# Patient Record
Sex: Male | Born: 1965
Health system: Southern US, Community
[De-identification: ages and names within clinical notes are randomized; demographics above are authoritative.]

## PROBLEM LIST (undated history)

## (undated) DIAGNOSIS — F329 Major depressive disorder, single episode, unspecified: Secondary | ICD-10-CM

## (undated) DIAGNOSIS — R7989 Other specified abnormal findings of blood chemistry: Secondary | ICD-10-CM

## (undated) DIAGNOSIS — F1910 Other psychoactive substance abuse, uncomplicated: Secondary | ICD-10-CM

## (undated) DIAGNOSIS — F1011 Alcohol abuse, in remission: Secondary | ICD-10-CM

## (undated) DIAGNOSIS — R945 Abnormal results of liver function studies: Secondary | ICD-10-CM

## (undated) DIAGNOSIS — F32A Depression, unspecified: Secondary | ICD-10-CM

## (undated) DIAGNOSIS — F419 Anxiety disorder, unspecified: Secondary | ICD-10-CM

## (undated) DIAGNOSIS — E785 Hyperlipidemia, unspecified: Secondary | ICD-10-CM

## (undated) HISTORY — DX: Anxiety disorder, unspecified: F41.9

## (undated) HISTORY — DX: Other specified abnormal findings of blood chemistry: R79.89

## (undated) HISTORY — DX: Major depressive disorder, single episode, unspecified: F32.9

## (undated) HISTORY — DX: Depression, unspecified: F32.A

## (undated) HISTORY — PX: INGUINAL HERNIA REPAIR: SUR1180

## (undated) HISTORY — DX: Alcohol abuse, in remission: F10.11

## (undated) HISTORY — PX: LUMBAR FUSION: SHX111

## (undated) HISTORY — DX: Abnormal results of liver function studies: R94.5

## (undated) HISTORY — DX: Other psychoactive substance abuse, uncomplicated: F19.10

## (undated) HISTORY — PX: WISDOM TOOTH EXTRACTION: SHX21

## (undated) HISTORY — DX: Hyperlipidemia, unspecified: E78.5

---

## 2004-02-04 ENCOUNTER — Encounter: Admission: RE | Admit: 2004-02-04 | Discharge: 2004-02-04 | Payer: Self-pay | Admitting: Family Medicine

## 2004-02-18 ENCOUNTER — Encounter: Admission: RE | Admit: 2004-02-18 | Discharge: 2004-02-18 | Payer: Self-pay | Admitting: Orthopedic Surgery

## 2004-03-01 ENCOUNTER — Encounter: Admission: RE | Admit: 2004-03-01 | Discharge: 2004-03-01 | Payer: Self-pay | Admitting: Family Medicine

## 2004-03-03 ENCOUNTER — Encounter: Admission: RE | Admit: 2004-03-03 | Discharge: 2004-03-03 | Payer: Self-pay | Admitting: Orthopedic Surgery

## 2004-03-22 ENCOUNTER — Encounter: Admission: RE | Admit: 2004-03-22 | Discharge: 2004-03-22 | Payer: Self-pay | Admitting: Orthopedic Surgery

## 2004-07-09 ENCOUNTER — Ambulatory Visit: Payer: Self-pay | Admitting: Internal Medicine

## 2004-10-07 ENCOUNTER — Ambulatory Visit: Payer: Self-pay | Admitting: Internal Medicine

## 2004-10-19 ENCOUNTER — Ambulatory Visit: Payer: Self-pay | Admitting: Internal Medicine

## 2005-04-13 ENCOUNTER — Ambulatory Visit: Payer: Self-pay | Admitting: Internal Medicine

## 2005-06-24 IMAGING — US US ABDOMEN LIMITED
1 series · 14 of 18 positions shown · non-contrast
Comparison: none

CLINICAL DATA: Renal cyst seen on lumbar spine MRI.      
LIMITED ABDOMINAL ULTRASOUND: 
Comparison none. 
Limited transabdominal sonographic imaging of the abdomen was performed with special attention to the right kidney.  
A 4.8 x 4.8 x 4.7cm anechoic well defined lesion extends exophytically from the inferior pole of the right kidney, corresponding to the abnormality seen on lumbar spine MRI.

[Series 1: unknown · 0.27mm/px · 14 of 18 slices shown]
[im 1/18]
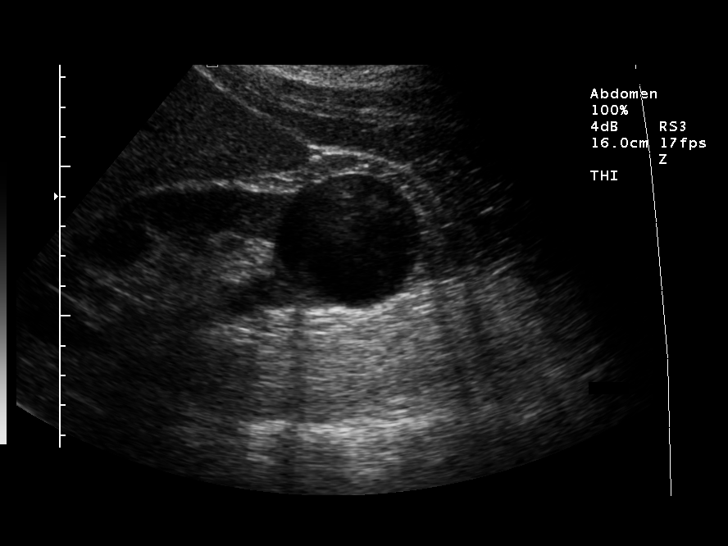
[im 2/18]
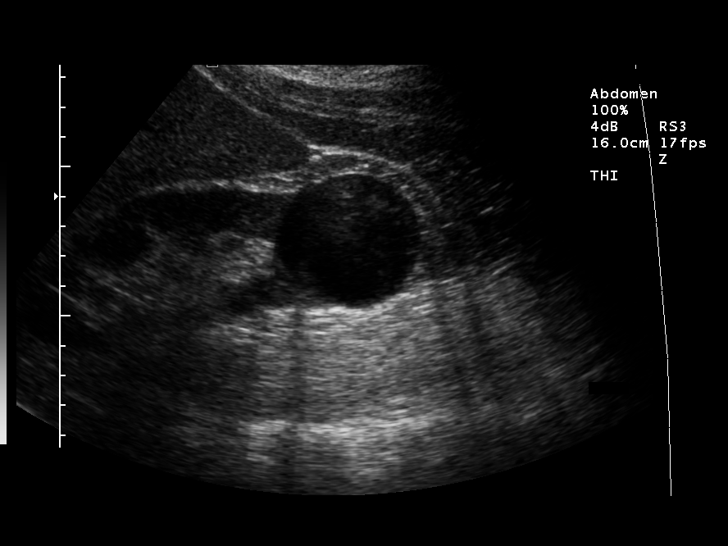
[im 4/18]
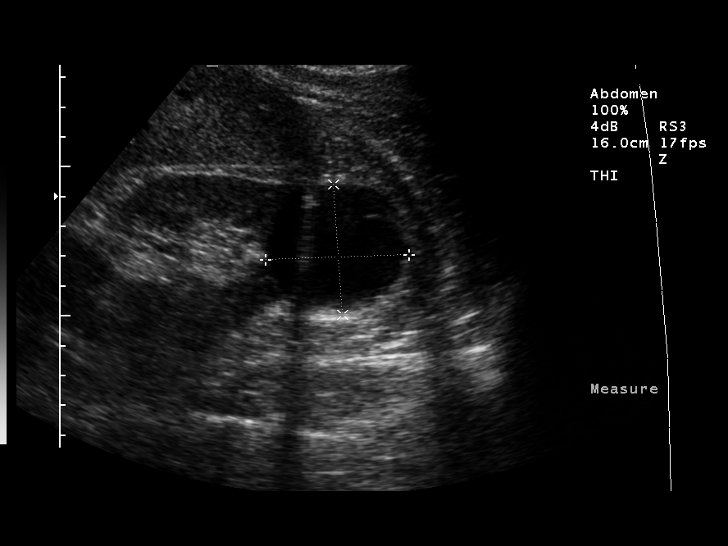
[im 5/18]
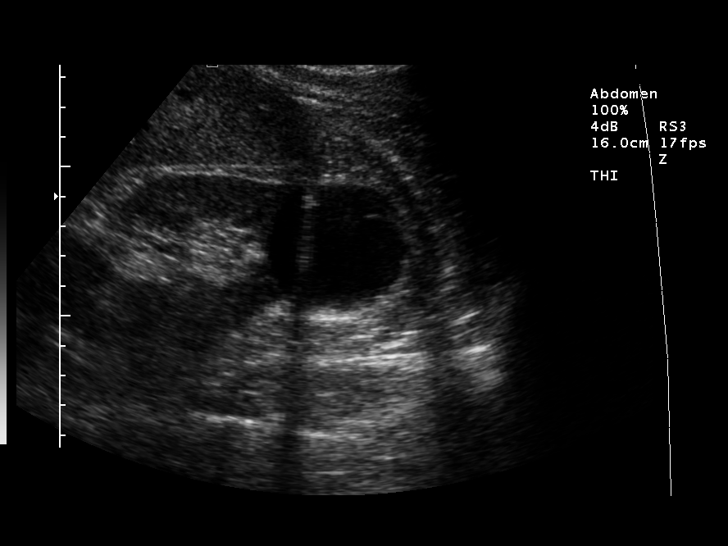
[im 6/18]
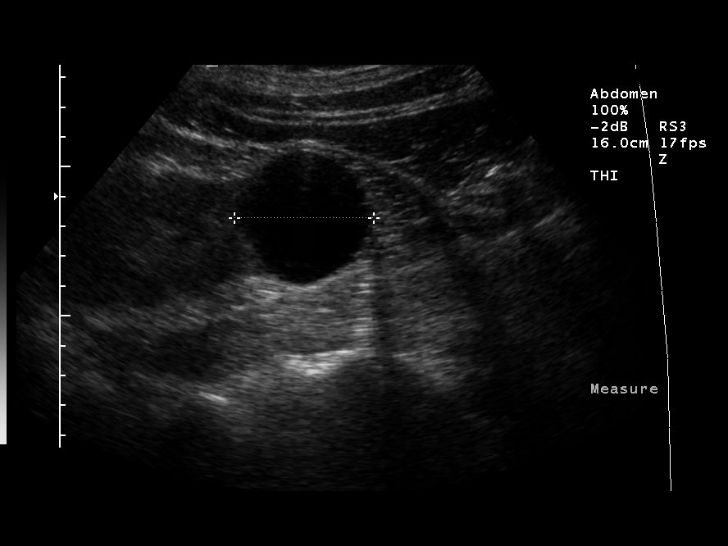
[im 8/18]
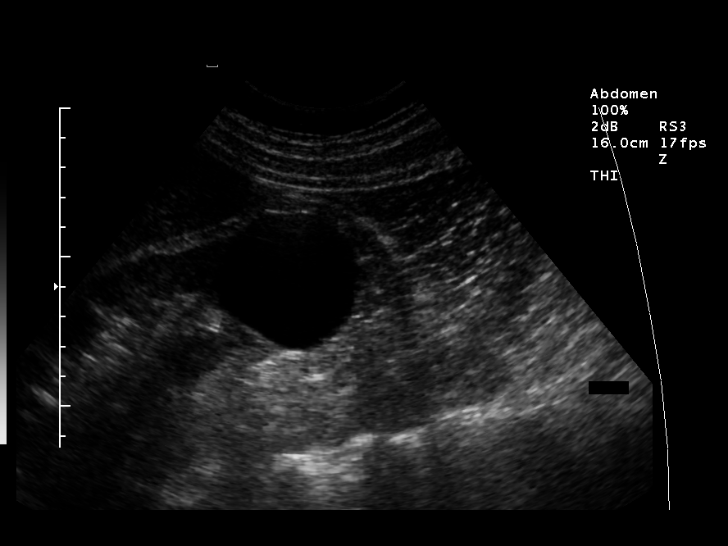
[im 9/18]
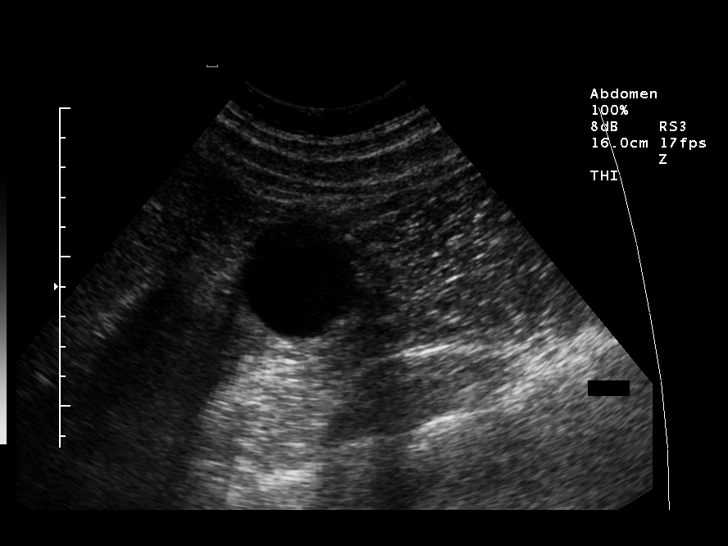
[im 10/18]
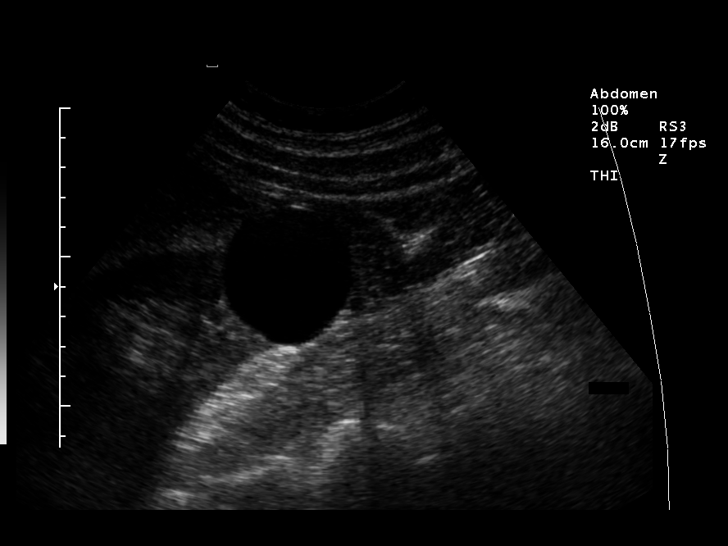
[im 11/18]
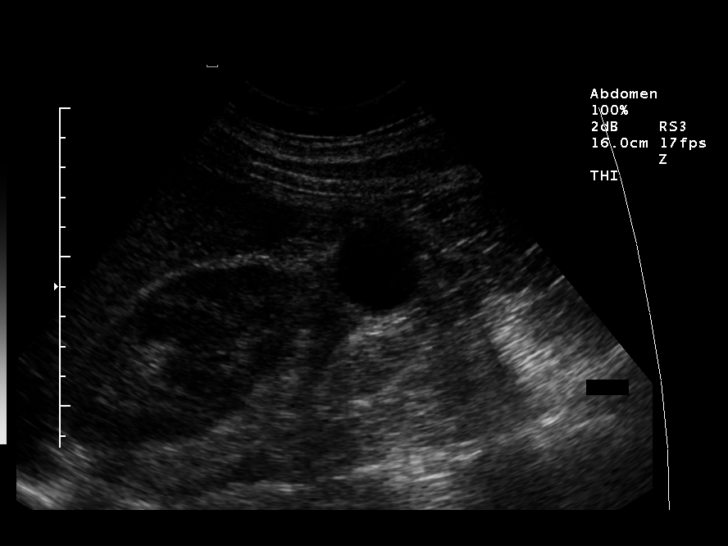
[im 13/18]
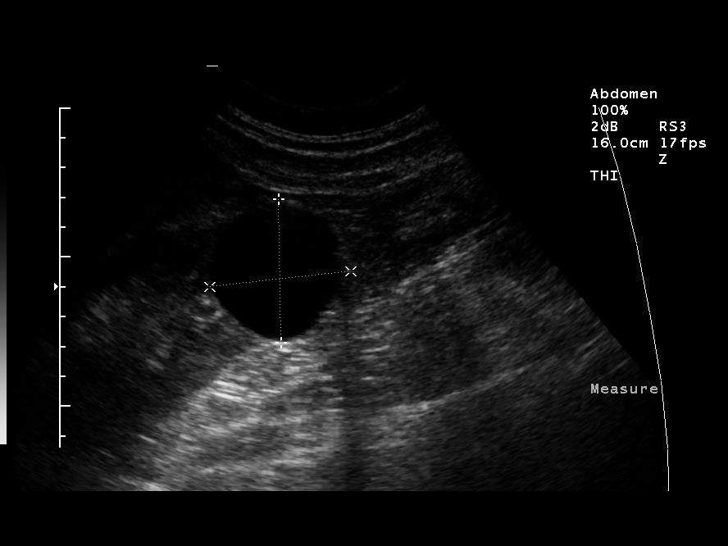
[im 14/18]
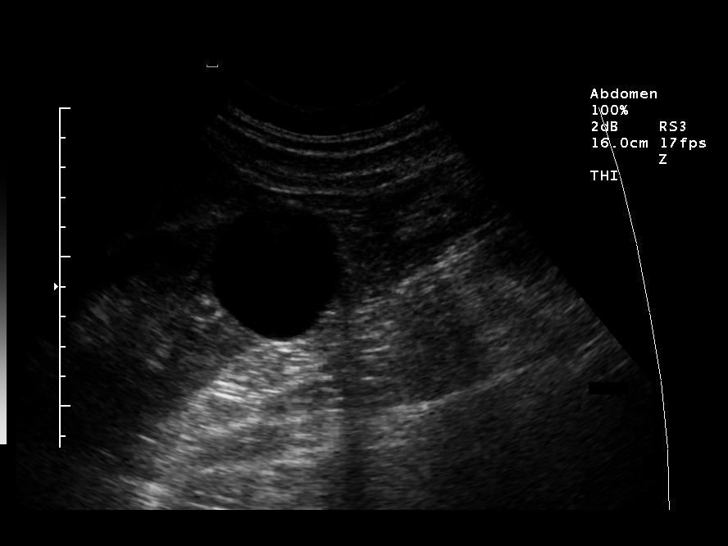
[im 15/18]
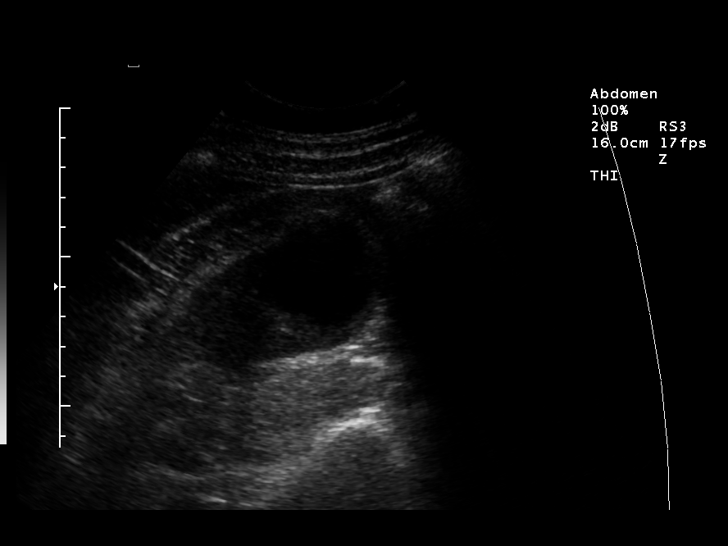
[im 17/18]
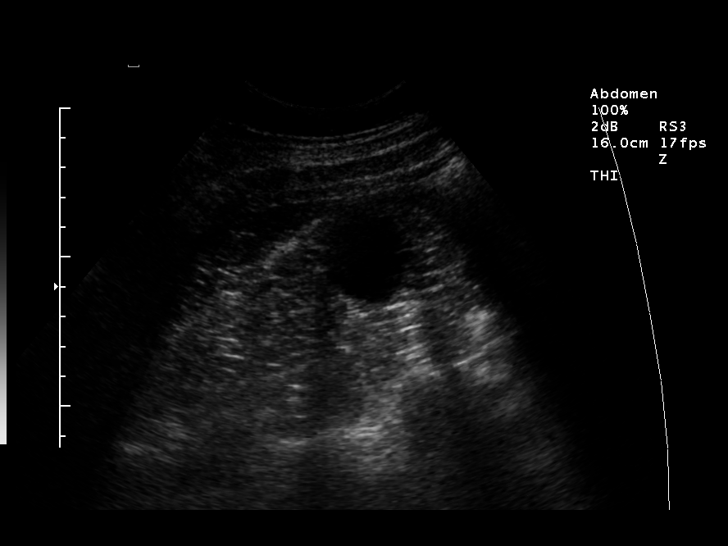
[im 18/18]
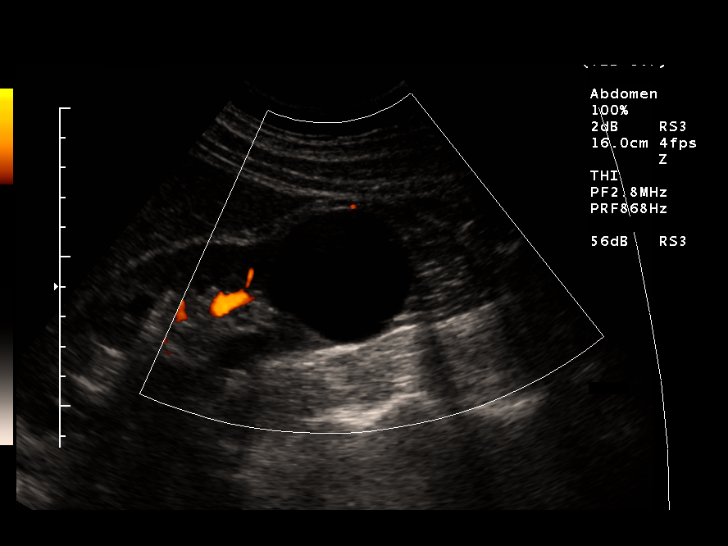

[14 of 18 positions shown; findings below may reference images not displayed]

IMPRESSION: 4.8 cm simple cyst in the lower pole of the right kidney.

## 2006-04-20 ENCOUNTER — Ambulatory Visit: Payer: Self-pay | Admitting: Internal Medicine

## 2006-07-12 ENCOUNTER — Ambulatory Visit: Payer: Self-pay | Admitting: Internal Medicine

## 2006-07-12 LAB — CONVERTED CEMR LAB
ALT: 41 units/L — ABNORMAL HIGH (ref 0–40)
AST: 42 units/L — ABNORMAL HIGH (ref 0–37)
Albumin: 4.7 g/dL (ref 3.5–5.2)
Alkaline Phosphatase: 68 units/L (ref 39–117)
BUN: 15 mg/dL (ref 6–23)
Chloride: 107 meq/L (ref 96–112)
Chol/HDL Ratio, serum: 3
Creatinine, Ser: 0.9 mg/dL (ref 0.4–1.5)
Glucose, Bld: 100 mg/dL — ABNORMAL HIGH (ref 70–99)
HDL: 73.7 mg/dL (ref >39.0)
LDL DIRECT: 126.3 mg/dL
MCHC: 33.1 g/dL (ref 30.0–36.0)
MCV: 90.8 fL (ref 78.0–100.0)
Neutrophils Relative %: 72.8 % (ref 43.0–77.0)
RDW: 12.6 % (ref 11.5–14.6)
Sodium: 146 meq/L — ABNORMAL HIGH (ref 135–145)
Total Bilirubin: 1.2 mg/dL (ref 0.3–1.2)
Total Protein: 7.8 g/dL (ref 6.0–8.3)
VLDL: 17 mg/dL (ref 0–40)
WBC: 15.7 10*3/uL — ABNORMAL HIGH (ref 4.5–10.5)

## 2006-07-19 ENCOUNTER — Ambulatory Visit: Payer: Self-pay | Admitting: Internal Medicine

## 2006-08-30 ENCOUNTER — Ambulatory Visit: Payer: Self-pay | Admitting: Internal Medicine

## 2006-08-30 LAB — CONVERTED CEMR LAB
Basophils Absolute: 0 10*3/uL (ref 0.0–0.1)
Basophils Relative: 0.4 % (ref 0.0–1.0)
Eosinophils Absolute: 0.4 10*3/uL (ref 0.0–0.6)
HCT: 45.8 % (ref 39.0–52.0)
Hemoglobin: 15.7 g/dL (ref 13.0–17.0)
Lymphocytes Relative: 33.6 % (ref 12.0–46.0)
MCHC: 34.3 g/dL (ref 30.0–36.0)
Monocytes Absolute: 0.7 10*3/uL (ref 0.2–0.7)
Monocytes Relative: 8.8 % (ref 3.0–11.0)
Platelets: 212 10*3/uL (ref 150–400)
RDW: 12.9 % (ref 11.5–14.6)
WBC: 7.8 10*3/uL (ref 4.5–10.5)

## 2006-11-30 ENCOUNTER — Ambulatory Visit: Payer: Self-pay | Admitting: Internal Medicine

## 2006-12-14 ENCOUNTER — Ambulatory Visit: Payer: Self-pay | Admitting: Internal Medicine

## 2007-01-26 ENCOUNTER — Encounter: Payer: Self-pay | Admitting: Internal Medicine

## 2007-05-04 ENCOUNTER — Telehealth: Payer: Self-pay | Admitting: Internal Medicine

## 2007-06-14 ENCOUNTER — Telehealth: Payer: Self-pay | Admitting: Internal Medicine

## 2008-03-31 ENCOUNTER — Ambulatory Visit: Payer: Self-pay | Admitting: Internal Medicine

## 2008-03-31 LAB — CONVERTED CEMR LAB
ALT: 53 units/L (ref 0–53)
Alkaline Phosphatase: 64 units/L (ref 39–117)
BUN: 15 mg/dL (ref 6–23)
Basophils Absolute: 0.1 10*3/uL (ref 0.0–0.1)
Bilirubin Urine: NEGATIVE
Calcium: 9.1 mg/dL (ref 8.4–10.5)
Chloride: 106 meq/L (ref 96–112)
Creatinine, Ser: 0.8 mg/dL (ref 0.4–1.5)
GFR calc non Af Amer: 113 mL/min
HDL: 49 mg/dL (ref >39.0)
Ketones, urine, test strip: NEGATIVE
MCHC: 35.3 g/dL (ref 30.0–36.0)
MCV: 87 fL (ref 78.0–100.0)
Monocytes Absolute: 0.7 10*3/uL (ref 0.1–1.0)
Neutro Abs: 2.3 10*3/uL (ref 1.4–7.7)
Protein, U semiquant: NEGATIVE
RDW: 12.2 % (ref 11.5–14.6)
Sodium: 144 meq/L (ref 135–145)
Specific Gravity, Urine: 1.02
TSH: 1.27 microintl units/mL (ref 0.35–5.50)
Total Bilirubin: 0.8 mg/dL (ref 0.3–1.2)
Triglycerides: 49 mg/dL (ref 0–149)
VLDL: 10 mg/dL (ref 0–40)
WBC: 6.3 10*3/uL (ref 4.5–10.5)

## 2008-04-09 ENCOUNTER — Ambulatory Visit: Payer: Self-pay | Admitting: Internal Medicine

## 2008-05-08 ENCOUNTER — Ambulatory Visit: Payer: Self-pay | Admitting: Internal Medicine

## 2008-08-05 ENCOUNTER — Telehealth: Payer: Self-pay | Admitting: Internal Medicine

## 2008-09-01 ENCOUNTER — Ambulatory Visit: Payer: Self-pay | Admitting: Internal Medicine

## 2008-09-08 ENCOUNTER — Encounter: Admission: RE | Admit: 2008-09-08 | Discharge: 2008-09-08 | Payer: Self-pay | Admitting: Internal Medicine

## 2008-09-26 ENCOUNTER — Encounter: Payer: Self-pay | Admitting: Internal Medicine

## 2008-10-27 ENCOUNTER — Inpatient Hospital Stay (HOSPITAL_COMMUNITY): Admission: RE | Admit: 2008-10-27 | Discharge: 2008-10-30 | Payer: Self-pay | Admitting: Neurosurgery

## 2008-10-27 ENCOUNTER — Ambulatory Visit: Payer: Self-pay | Admitting: Vascular Surgery

## 2008-11-19 ENCOUNTER — Ambulatory Visit: Payer: Self-pay | Admitting: Internal Medicine

## 2008-11-19 ENCOUNTER — Telehealth: Payer: Self-pay | Admitting: Internal Medicine

## 2008-11-25 ENCOUNTER — Telehealth: Payer: Self-pay | Admitting: Internal Medicine

## 2008-11-28 ENCOUNTER — Telehealth: Payer: Self-pay | Admitting: Internal Medicine

## 2008-12-09 ENCOUNTER — Encounter: Payer: Self-pay | Admitting: Internal Medicine

## 2009-07-09 ENCOUNTER — Telehealth: Payer: Self-pay | Admitting: Internal Medicine

## 2009-08-07 ENCOUNTER — Telehealth: Payer: Self-pay | Admitting: Internal Medicine

## 2009-08-10 DIAGNOSIS — F32A Depression, unspecified: Secondary | ICD-10-CM | POA: Insufficient documentation

## 2009-08-10 DIAGNOSIS — F329 Major depressive disorder, single episode, unspecified: Secondary | ICD-10-CM

## 2009-08-28 ENCOUNTER — Encounter: Payer: Self-pay | Admitting: Internal Medicine

## 2009-08-28 ENCOUNTER — Telehealth: Payer: Self-pay | Admitting: Internal Medicine

## 2009-10-09 ENCOUNTER — Telehealth: Payer: Self-pay | Admitting: Internal Medicine

## 2010-02-19 IMAGING — RF DG LUMBAR SPINE 2-3V
1 series · 2 of 2 positions shown · non-contrast
Comparison: 09/08/2008

CLINICAL DATA: Anterior fusion L4-5

LUMBAR SPINE - 2-3 VIEW

[Series 1: run · 2 of 2 slices shown]
[im 1/2]
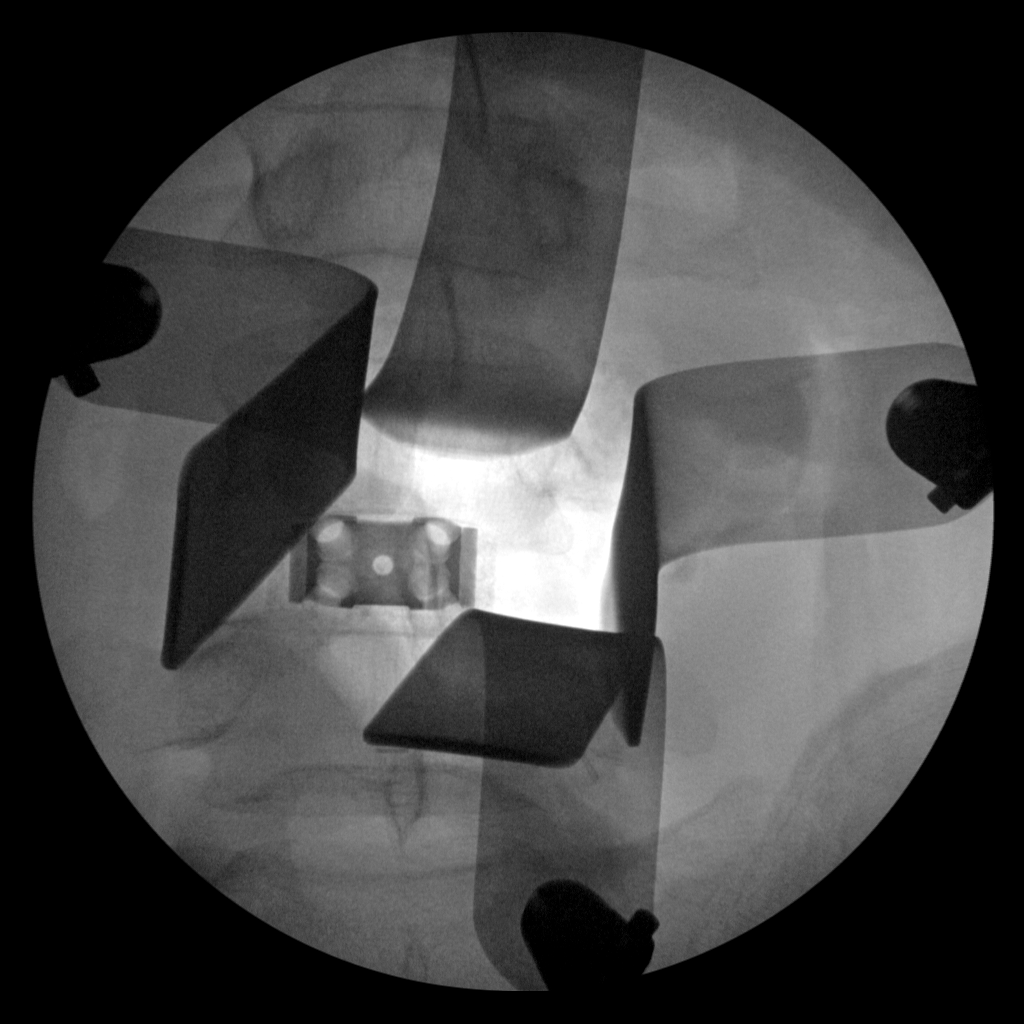
[im 2/2]
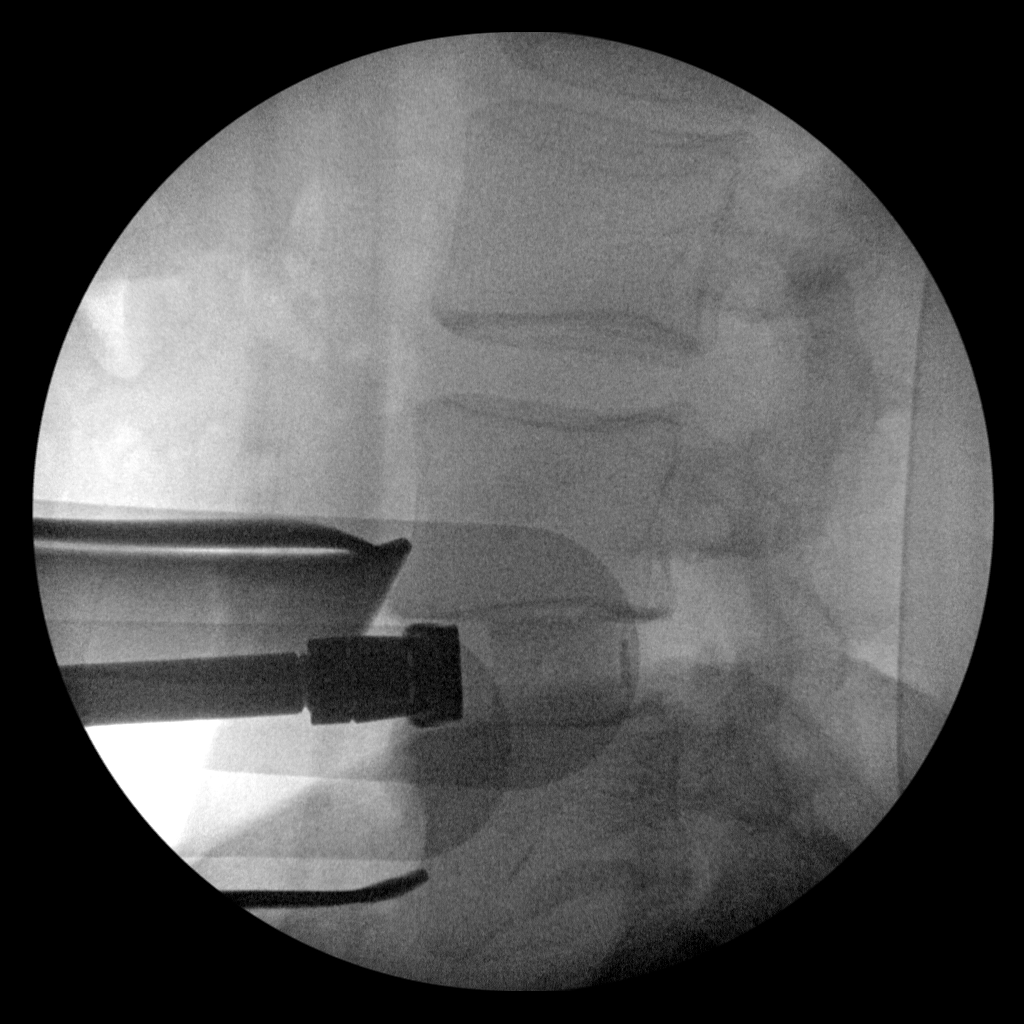

[2 of 2 positions shown; findings below may reference images not displayed]

FINDINGS: Two C-arm images show anterior discectomy and fusion and
progress at the L4-5 level.  No radiographically detectable
complication.
IMPRESSION: Anterior fusion in progress at L4-5.

## 2010-02-19 IMAGING — CR DG CHEST 1V PORT
1 series · 1 of 1 positions shown · non-contrast
Comparison: None

CLINICAL DATA: Status post anterior lumbar interbody fusion with
central line placement.

PORTABLE CHEST - 1 VIEW

[view not recorded]
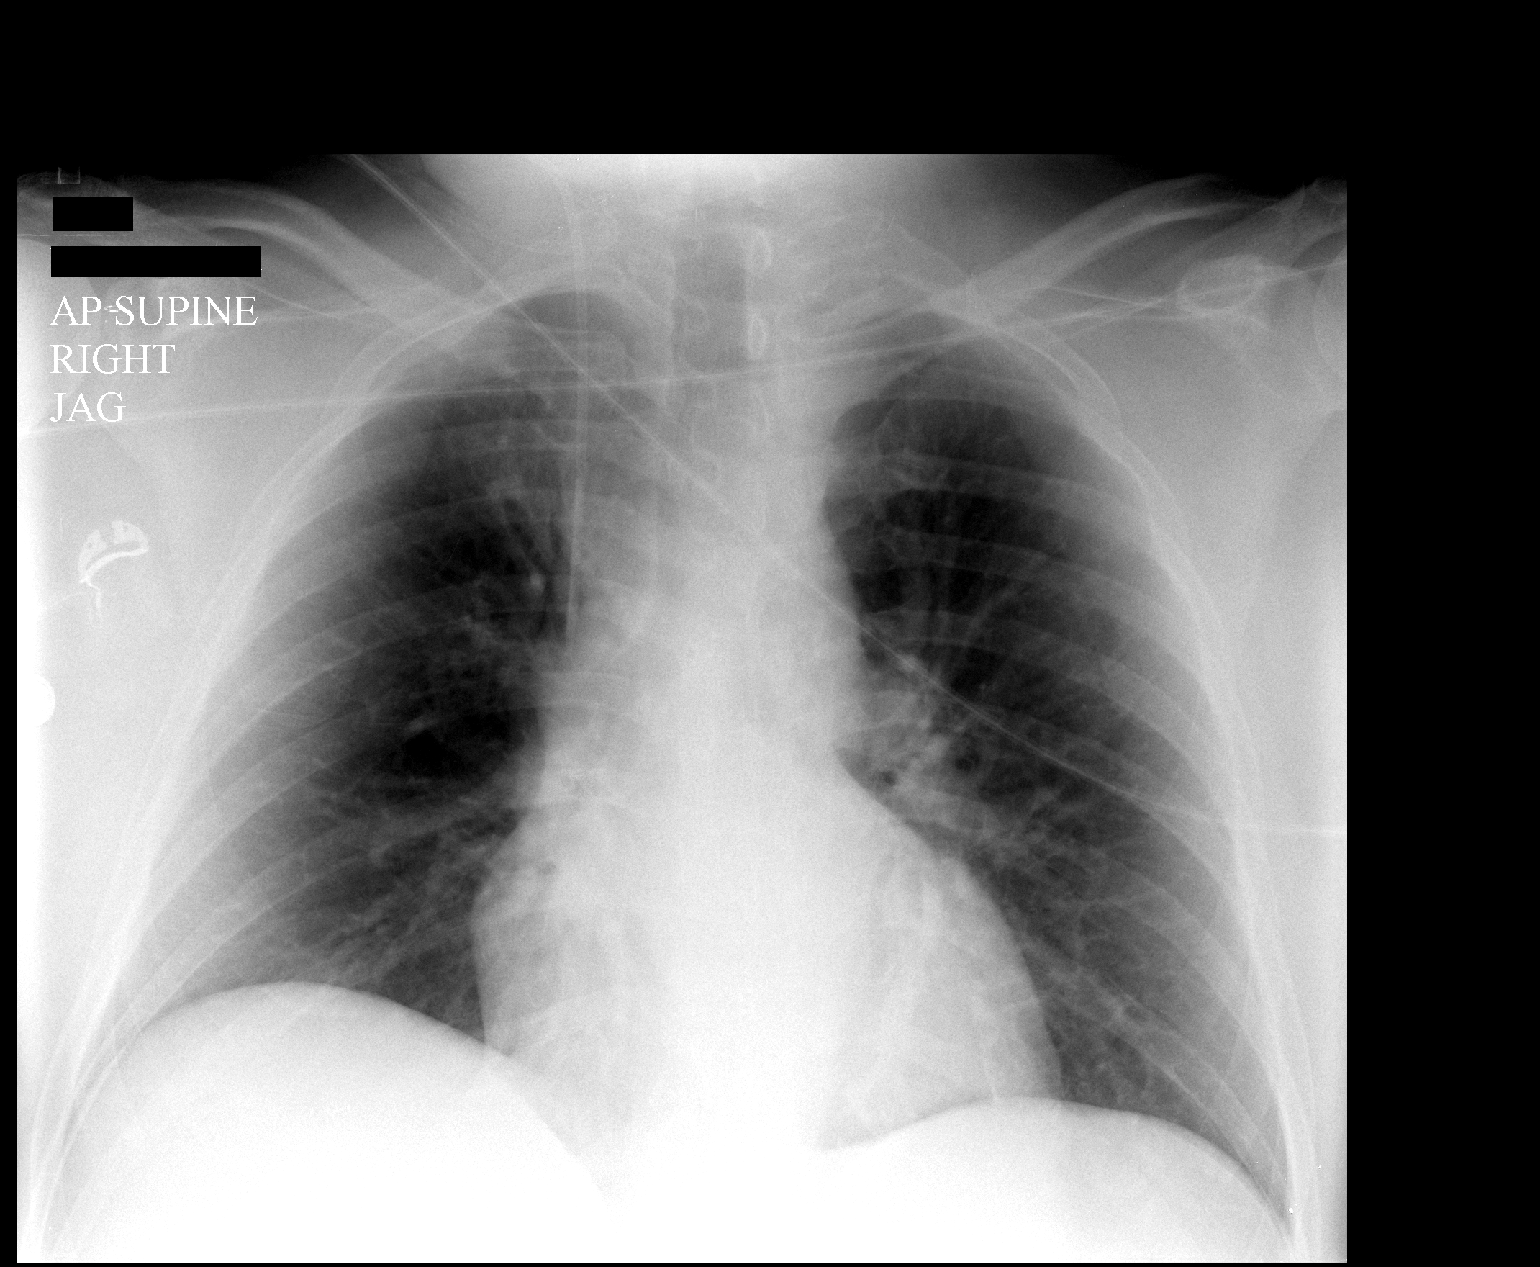

[1 of 1 positions shown; findings below may reference images not displayed]

FINDINGS: Trachea is midline.  Heart size normal.  Right IJ central
line tip projects over the SVC.  No pneumothorax.  Lungs are clear.
No pleural fluid.
IMPRESSION: 1.  Right IJ central line placement without pneumothorax.
2.  No acute findings.

## 2010-02-19 IMAGING — CR DG OR LOCAL ABDOMEN
1 series · 1 of 1 positions shown · non-contrast
Comparison: None.

CLINICAL DATA: 42-year-old male undergoing L4-L5 anterior lumbar
interbody fusion.  Query retained OR instrument.

OR LOCAL ABDOMEN

[AP]
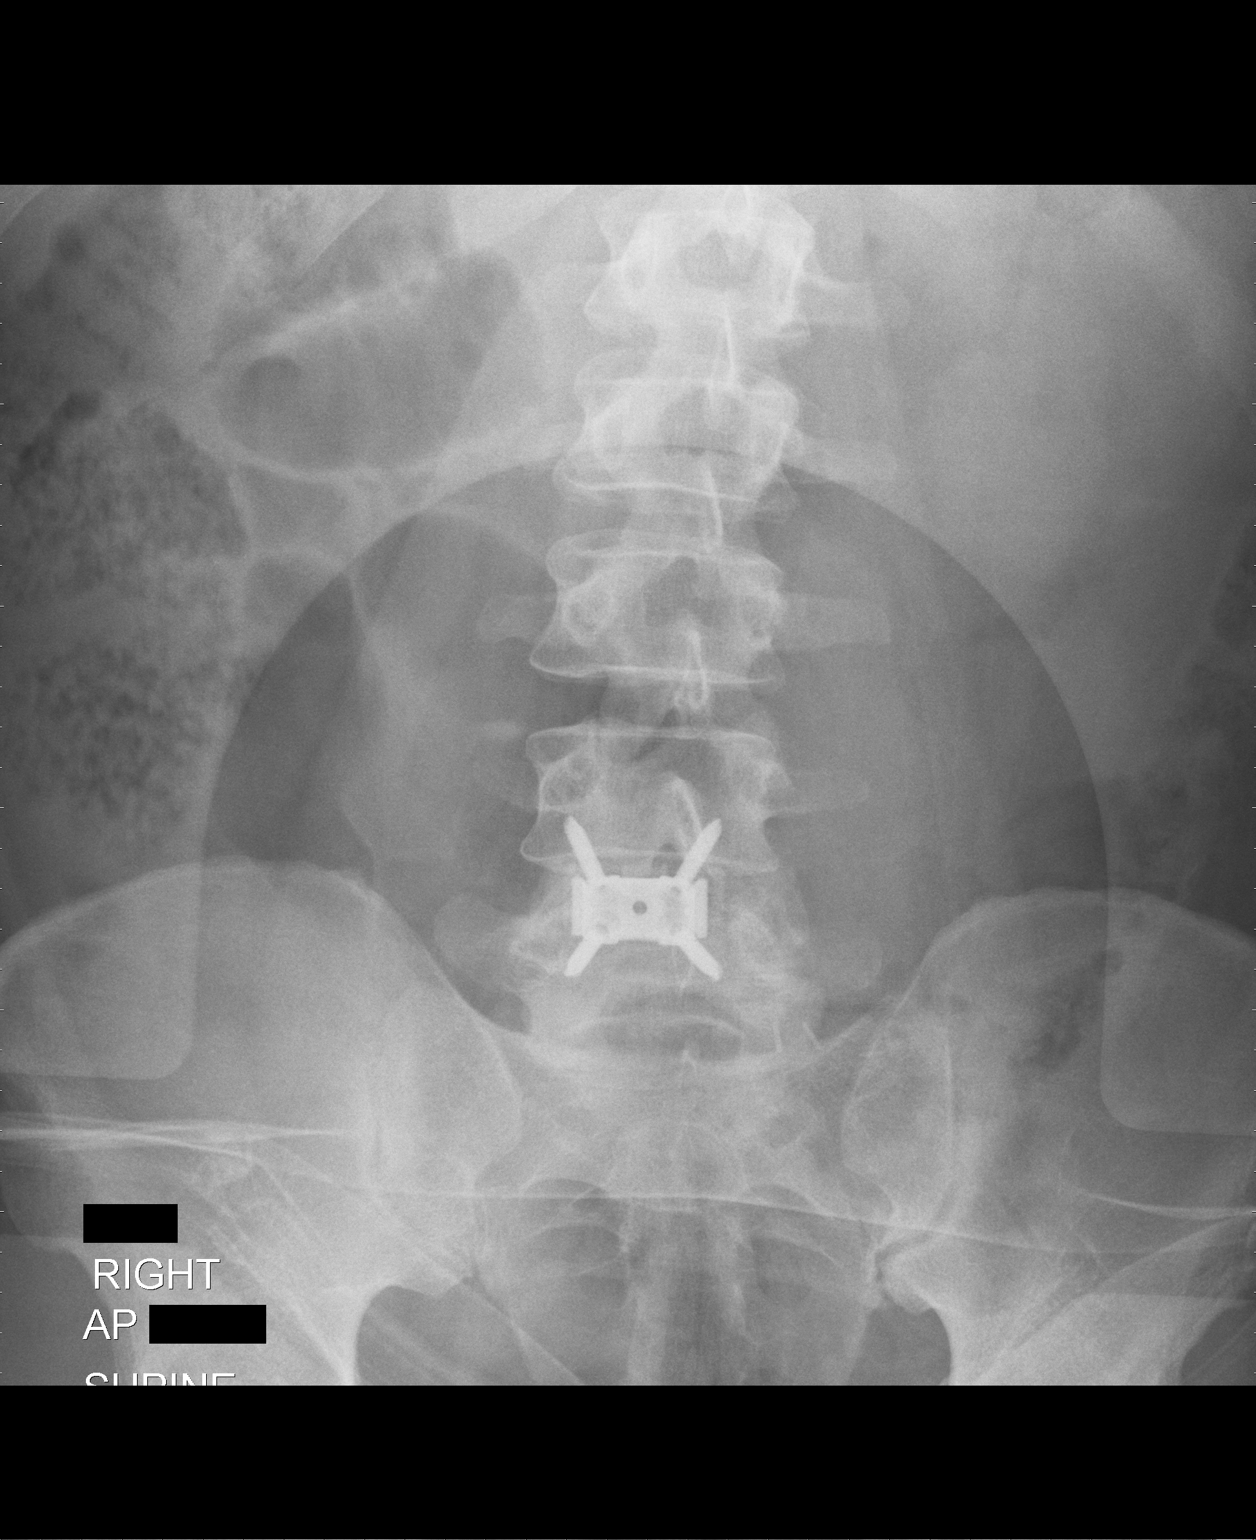

[1 of 1 positions shown; findings below may reference images not displayed]

FINDINGS: AP portable supine view 8805 hours.  Anterior interbody
fusion device at L4-L5.  Expected small surgical clips at the left
L5-S1 junction.  No retained OR instrument identified.
Nonobstructed bowel gas pattern.
IMPRESSION: L4-L5 anterior lumbar interbody fusion sequelae.  No retained
instrument.

## 2010-03-23 ENCOUNTER — Ambulatory Visit: Payer: Self-pay | Admitting: Internal Medicine

## 2010-03-23 LAB — CONVERTED CEMR LAB
Cholesterol: 189 mg/dL (ref 0–200)
HDL: 47.8 mg/dL (ref >39.00)
Triglycerides: 26 mg/dL (ref 0.0–149.0)
VLDL: 5.2 mg/dL (ref 0.0–40.0)

## 2010-03-24 ENCOUNTER — Encounter: Payer: Self-pay | Admitting: Internal Medicine

## 2010-04-16 ENCOUNTER — Ambulatory Visit: Payer: Self-pay | Admitting: Internal Medicine

## 2010-04-16 DIAGNOSIS — J020 Streptococcal pharyngitis: Secondary | ICD-10-CM | POA: Insufficient documentation

## 2010-07-25 ENCOUNTER — Encounter: Payer: Self-pay | Admitting: Family Medicine

## 2010-08-04 NOTE — Progress Notes (Signed)
Summary: Chantix  Phone Note Call from Patient   Caller: Patient Call For: Stacie Glaze MD Reason for Call: Acute Illness Summary of Call: Pt would like to have Chantix called to [facility]. He has set a date to stop smoking 07/20/2008. [phone] Initial call taken by: Lynann Beaver CMA,  July 09, 2009 1:01 PM  Follow-up for Phone Call        per dr Lovell Sheehan- ok to call in first chantix starting pack Follow-up by: Willy Eddy, LPN,  July 09, 2009 1:37 PM    New/Updated Medications: CHANTIX STARTING MONTH PAK 0.5 MG X 11 & 1 MG X 42 TABS (VARENICLINE TARTRATE) as directed Prescriptions: CHANTIX STARTING MONTH PAK 0.5 MG X 11 & 1 MG X 42 TABS (VARENICLINE TARTRATE) as directed  #1 pack x 0   Entered by:   Lynann Beaver CMA   Authorized by:   Stacie Glaze MD   Signed by:   Lynann Beaver CMA on 07/09/2009   Method used:   Electronically to        [facility]* (retail)       [address]       [place], [place]  [id number]       Ph: [phone]       Fax: [phone]   RxID:   [id number]  Pt. notified.

## 2010-08-04 NOTE — Letter (Signed)
Summary: Lipid Letter  [facility]  [address] [place], [place] [address]   Phone: [phone]  Fax: [phone]    03/24/2010  Ethan Crosby [address] [place], [place]  [address]  Dear Ethan Crosby:  We have carefully reviewed your last lipid profile from 03/23/2010 and the results are noted below with a summary of recommendations for lipid management.    Cholesterol:       189     Goal: <200   HDL "good" Cholesterol:   57.84     Goal: >40 great!   LDL "bad" Cholesterol:   136     Goal: <130   Triglycerides:       26.0     Goal: <150    HIV test is negative    TLC Diet (Therapeutic Lifestyle Change): Saturated Fats & Transfatty acids should be kept < 7% of total calories ***Reduce Saturated Fats Polyunstaurated Fat can be up to 10% of total calories Monounsaturated Fat Fat can be up to 20% of total calories Total Fat should be no greater than 25-35% of total calories Carbohydrates should be 50-60% of total calories Protein should be approximately 15% of total calories Fiber should be at least 20-30 grams a day ***Increased fiber may help lower LDL Total Cholesterol should be < 200mg /day Consider adding plant stanol/sterols to diet (example: Benacol spread) ***A higher intake of unsaturated fat may reduce Triglycerides and Increase HDL    Adjunctive Measures (may lower LIPIDS and reduce risk of Heart Attack) include: Aerobic Exercise (20-30 minutes 3-4 times a week) Limit Alcohol Consumption Weight Reduction Aspirin 75-81 mg a day by mouth (if not allergic or contraindicated) Dietary Fiber 20-30 grams a day by mouth     Current Medications: 1)    Venlafaxine Hcl 150 Mg Xr24h-tab (Venlafaxine hcl) .... One by mouth daily 2)    Chantix Continuing Month Pak 1 Mg Tabs (Varenicline tartrate) .... One by mouth bid  If you have any questions, please call. We appreciate being able to work with you.   Sincerely,    [facility] Ethan Glaze  MD

## 2010-08-04 NOTE — Assessment & Plan Note (Signed)
Summary: DR Nemiah Commander PT/NO SLOT--SEVERE SORE THROAT--STC   Vital Signs:  Patient profile:   45 year old male Height:      67 inches Weight:      188 pounds BMI:     29.55 O2 Sat:      97 % on Room air Temp:     98.8 degrees F oral Pulse rate:   87 / minute Pulse rhythm:   regular Resp:     16 per minute BP sitting:   124 / 78  (left arm) Cuff size:   large  Vitals Entered By: Rock Nephew CMA (April 16, 2010 10:06 AM)  Nutrition Counseling: Patient's BMI is greater than 25 and therefore counseled on weight management options.  O2 Flow:  Room air CC: Patient c/o sore throat, swollen neck lymph nodes x 04/13/10, URI symptoms Is Patient Diabetic? No Pain Assessment Patient in pain? yes     Location: throat  Does patient need assistance? Functional Status Self care Ambulation Normal   Primary Care Kambrey Hagger:  Yetta Barre  CC:  Patient c/o sore throat, swollen neck lymph nodes x 04/13/10, and URI symptoms.  History of Present Illness:  URI Symptoms      This is a 45 year old man who presents with URI symptoms.  The symptoms began 4 days ago.  The severity is described as moderate.  The patient reports sore throat, but denies nasal congestion, clear nasal discharge, purulent nasal discharge, dry cough, productive cough, earache, and sick contacts.  Associated symptoms include low-grade fever (<100.5 degrees), use of an antipyretic, and response to antipyretic.  The patient denies stiff neck, dyspnea, wheezing, rash, vomiting, and diarrhea.  The patient also reports headache.  The patient denies itchy throat, sneezing, seasonal symptoms, muscle aches, and severe fatigue.  Risk factors for Strep sinusitis include Strep exposure, tender adenopathy, and absence of cough.  The patient denies the following risk factors for Strep sinusitis: unilateral facial pain, unilateral nasal discharge, and double sickening.    Preventive Screening-Counseling & Management  Alcohol-Tobacco     Alcohol  drinks/day: 0     Alcohol Counseling: not indicated; patient does not drink     Smoking Status: current     Smoking Cessation Counseling: yes     Smoke Cessation Stage: ready     Packs/Day: 0.5     Tobacco Counseling: to quit use of tobacco products  Hep-HIV-STD-Contraception     Hepatitis Risk: no risk noted     HIV Risk: no risk noted     STD Risk: no risk noted     Dental Visit-last 6 months yes     Dental Care Counseling: to seek dental care; no dental care within six months     TSE monthly: yes     Testicular SE Education/Counseling to perform regular STE     Sun Exposure-Excessive: yes     Sun Exposure Counseling: to decrease sun exposure      Sexual History:  currently monogamous.        Drug Use:  never and no.        Blood Transfusions:  no.    Medications Prior to Update: 1)  Venlafaxine Hcl 150 Mg Xr24h-Tab (Venlafaxine Hcl) .... One By Mouth Daily 2)  Chantix Continuing Month Pak 1 Mg Tabs (Varenicline Tartrate) .... One By Mouth Bid  Current Medications (verified): 1)  Venlafaxine Hcl 150 Mg Xr24h-Tab (Venlafaxine Hcl) .... One By Mouth Daily 2)  Chantix Continuing Month Pak 1 Mg Tabs (  Varenicline Tartrate) .... One By Mouth Bid 3)  Avelox 400 Mg Tabs (Moxifloxacin Hcl) .... 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 Family History Hypertension NHL  Social History: Reviewed history from 03/23/2010 and no changes required. 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  The patient denies anorexia, weight loss, hoarseness, peripheral edema, prolonged cough, hemoptysis, abdominal pain, and suspicious skin lesions.    Physical Exam  General:  alert, well-developed, well-nourished, well-hydrated, appropriate dress, normal appearance, healthy-appearing, cooperative to examination, and good hygiene.   Head:  normocephalic, atraumatic, no abnormalities observed, and no abnormalities palpated.   Ears:  R ear normal and L ear normal.   Nose:  External nasal examination shows no deformity or inflammation. Nasal mucosa are pink and moist without lesions or exudates. Mouth:  good dentition, no gingival abnormalities, no exudates, no posterior lymphoid hypertrophy, no postnasal drip, no pharyngeal crowding, no lesions, no erosions, no tongue abnormalities, no leukoplakia, no petechiae, pharyngeal erythema, and tonsil hypertropied.   Neck:  supple, full ROM, no masses, no thyromegaly, no JVD, and cervical lymphadenopathy.   Lungs:  normal respiratory effort, no intercostal retractions, no accessory muscle use, normal breath sounds, no dullness, no fremitus, no crackles, and no wheezes.   Heart:  normal rate, regular rhythm, no murmur, no gallop, no rub, and no JVD.   Abdomen:  soft, non-tender, normal bowel sounds, no distention, no masses, no guarding, and no rigidity.   Msk:  No deformity or scoliosis noted of thoracic or lumbar spine.   Pulses:  R and L carotid,radial,femoral,dorsalis pedis and posterior tibial pulses are full and equal bilaterally Extremities:  No clubbing, cyanosis, edema, or deformity noted with normal full range of  motion of all joints.   Neurologic:  No cranial nerve deficits noted. Station and gait are normal. Plantar reflexes are down-going bilaterally. DTRs are symmetrical throughout. Sensory, motor and coordinative functions appear intact. Skin:  Intact without suspicious lesions or rashes Cervical Nodes:  R anterior LN tender and L anterior LN tender.   Axillary Nodes:  no R axillary adenopathy and no L axillary adenopathy.   Psych:  Cognition and judgment appear intact. Alert and cooperative with normal attention span and concentration. No apparent delusions, illusions, hallucinations   Impression & Recommendations:  Problem # 1:  STREP THROAT (ICD-034.0) Assessment New  His updated medication list for this problem includes:    Avelox 400 Mg Tabs (Moxifloxacin hcl) ..... One by mouth once daily for 7 days  Instructed to complete antibiotics and call if not improved in 48 hours.   Complete Medication List: 1)  Venlafaxine Hcl 150 Mg Xr24h-tab (Venlafaxine hcl) .... One by mouth daily 2)  Chantix Continuing Month Pak 1 Mg Tabs (Varenicline tartrate) .... One by mouth bid 3)  Avelox 400 Mg Tabs (Moxifloxacin  hcl) .... One by mouth once daily for 7 days  Patient Instructions: 1)  Please schedule a follow-up appointment in 1 month. 2)  Get plenty of rest, drink lots of clear liquids, and use Tylenol or Ibuprofen for fever and comfort. Return in 7-10 days if you're not better:sooner if you're feeling worse. 3)  Take 650-1000mg  of Tylenol every 4-6 hours as needed for relief of pain or comfort of fever AVOID taking more than 4000mg   in a 24 hour period (can cause liver damage in higher doses). 4)  Take 400-600mg  of Ibuprofen (Advil, Motrin) with food every 4-6 hours as needed for relief of pain or comfort of fever. 5)  Take your antibiotic as prescribed until ALL of it is gone, but stop if you develop a rash or swelling and contact our office as soon as possible. Prescriptions: AVELOX 400 MG TABS  (MOXIFLOXACIN HCL) One by mouth once daily for 7 days  #7 x 0   Entered and Authorized by:   Etta Grandchild MD   Signed by:   Etta Grandchild MD on 04/16/2010   Method used:   Samples Given   RxID:   [id number]

## 2010-08-04 NOTE — Medication Information (Signed)
Summary: Denial of Coverage for Venlafaxine  Denial of Coverage for Venlafaxine   Imported By: Maryln Gottron 09/01/2009 10:23:58  _____________________________________________________________________  External Attachment:    Type:   Image     Comment:   External Document

## 2010-08-04 NOTE — Progress Notes (Signed)
Summary: Effexor Prior Authorization  Phone Note Outgoing Call   Call placed by: Trixie Dredge,  August 26, 2009 2:35 PM Call placed to: [facility] Summary of Call: Called to provide information to have prior authorization for venlafaxine approved.  Was advised by the rep that a prior is not required for this med.  Stated pt had rx filled 05/19/09 for a 90 day suppy.  Then requested a 30 day supply of the branded Effexor on 08/10/09.  This was to soon to request a refill therefore it was denied.  Patient can have Effexor refilled on or after 09/07/09. Initial call taken by: Trixie Dredge,  August 26, 2009 2:38 PM  Follow-up for Phone Call        Recieved a fax from [employer] asking for additionat information for prior auth to be approved.  Called and spoke with rep and inquired why we are getting this fax and pa was not required for the med.  Was advised case would be closed and to disregard the fax. Follow-up by: Trixie Dredge,  August 28, 2009 2:40 PM

## 2010-08-04 NOTE — Progress Notes (Signed)
Summary: refills  Phone Note Call from Patient   Caller: Patient Call For: Stacie Glaze MD Summary of Call: Pt wants Chantix and Effexor sent to [facility] today. [phone] Initial call taken by: Lynann Beaver CMA,  August 07, 2009 9:36 AM    New/Updated Medications: CHANTIX CONTINUING MONTH PAK 1 MG TABS (VARENICLINE TARTRATE) take as directed Prescriptions: CHANTIX CONTINUING MONTH PAK 1 MG TABS (VARENICLINE TARTRATE) take as directed  #1 pak x 1   Entered by:   Willy Eddy, LPN   Authorized by:   Stacie Glaze MD   Signed by:   Willy Eddy, LPN on 45/40/9811   Method used:   Electronically to        [facility]* (retail)       [address]       [place], [place]  [address]       Ph: [phone]       Fax: [phone]   RxID:   [id number] VENLAFAXINE HCL 150 MG XR24H-TAB (VENLAFAXINE HCL) one by mouth daily  #90 x 1   Entered by:   Willy Eddy, LPN   Authorized by:   Stacie Glaze MD   Signed by:   Willy Eddy, LPN on 03/47/4259   Method used:   Electronically to        [facility]* (retail)       [address]       [place], [place]  [id number]       Ph: [phone]       Fax: [phone]   RxID:   [id number]   Appended Document: Preload-Problems-Medications-Allergies      Preload Clinical Lists   DEPRESSION (ICD-311)   Past History:  Past Medical History: elevated LFTs Hx of ETOH abuse Depression

## 2010-08-04 NOTE — Assessment & Plan Note (Signed)
Summary: NEW PT CPX/ TRANSFER FROM DR JENKINS/ [facility]   Vital Signs:  Patient profile:   45 year old male Height:      67 inches Weight:      189 pounds BMI:     29.71 O2 Sat:      98 % on Room air Temp:     97.8 degrees F oral Pulse rate:   90 / minute Pulse rhythm:   regular Resp:     16 per minute BP sitting:   122 / 80  (left arm)  Vitals Entered By: Lamar Sprinkles, CMA (March 23, 2010 10:33 AM)  Nutrition Counseling: Patient's BMI is greater than 25 and therefore counseled on weight management options.  O2 Flow:  Room air CC: New to establish/transfer care from Dr Lovell Sheehan. Pt wants MD to be aware is has history of drug alcohol abuse- never wants rx that may be addictive.    Primary Care Provider:  Yetta Barre  CC:  New to establish/transfer care from Dr Lovell Sheehan. Pt wants MD to be aware is has history of drug alcohol abuse- never wants rx that may be addictive. Marland Kitchen  History of Present Illness: New to me for a complete physical, he is trying to quit smoking and has started Chantix again. He wants an HIV test done though he has not had any symptoms and describes safe sexual practices.  Preventive Screening-Counseling & Management  Alcohol-Tobacco     Alcohol drinks/day: 0     Smoking Status: current     Smoking Cessation Counseling: yes     Smoke Cessation Stage: ready     Packs/Day: 0.5     Tobacco Counseling: to quit use of tobacco products  Caffeine-Diet-Exercise     Does Patient Exercise: yes  Hep-HIV-STD-Contraception     Hepatitis Risk: no risk noted     HIV Risk: no risk noted     STD Risk: no risk noted     Dental Visit-last 6 months yes     Dental Care Counseling: to seek dental care; no dental care within six months     TSE monthly: yes     Testicular SE Education/Counseling to perform regular STE     Sun Exposure-Excessive: yes     Sun Exposure Counseling: to decrease sun exposure  Safety-Violence-Falls     Seat Belt Use: yes     Helmet Use: n/a  Firearms in the Home: no firearms in the home     Smoke Detectors: yes     Violence in the Home: no risk noted     Sexual Abuse: no      Sexual History:  currently monogamous.        Drug Use:  never and no.        Blood Transfusions:  no.    Clinical Review Panels:  Immunizations   Last Tetanus Booster:  Historical (01/02/2007)   Last Flu Vaccine:  Fluvax 3+ (03/23/2010)  Lipid Management   Cholesterol:  206 (03/31/2008)   LDL (bad choesterol):  DEL (03/31/2008)   HDL (good cholesterol):  49.0 (03/31/2008)   Triglycerides:  84 (07/12/2006)  Diabetes Management   Creatinine:  0.8 (03/31/2008)   Last Flu Vaccine:  Fluvax 3+ (03/23/2010)  CBC   WBC:  6.3 (03/31/2008)   RBC:  4.67 (03/31/2008)   Hgb:  14.3 (03/31/2008)   Hct:  40.6 (03/31/2008)   Platelets:  211 (03/31/2008)   MCV  87.0 (03/31/2008)   MCHC  35.3 (03/31/2008)   RDW  12.2 (03/31/2008)   PMN:  37.1 (03/31/2008)   Lymphs:  43.1 (03/31/2008)   Monos:  10.4 (03/31/2008)   Eosinophils:  8.2 (03/31/2008)   Basophil:  1.2 (03/31/2008)  Complete Metabolic Panel   Glucose:  93 (03/31/2008)   Sodium:  144 (03/31/2008)   Potassium:  4.7 (03/31/2008)   Chloride:  106 (03/31/2008)   CO2:  29 (03/31/2008)   BUN:  15 (03/31/2008)   Creatinine:  0.8 (03/31/2008)   Albumin:  4.2 (03/31/2008)   Total Protein:  6.9 (03/31/2008)   Calcium:  9.1 (03/31/2008)   Total Bili:  0.8 (03/31/2008)   Alk Phos:  64 (03/31/2008)   SGPT (ALT):  53 (03/31/2008)   SGOT (AST):  40 (03/31/2008)   Medications Prior to Update: 1)  Venlafaxine Hcl 150 Mg Xr24h-Tab (Venlafaxine Hcl) .... One By Mouth Daily 2)  Keflex 500 Mg Caps (Cephalexin) .Marland Kitchen.. 1 Three Times A Day For 7 Days 3)  Chantix Starting Month Pak 0.5 Mg X 11 & 1 Mg X 42 Tabs (Varenicline Tartrate) .... As Directed 4)  Chantix Continuing Month Pak 1 Mg Tabs (Varenicline Tartrate) .... Take As Directed  Current Medications (verified): 1)  Venlafaxine Hcl 150 Mg Xr24h-Tab  (Venlafaxine Hcl) .... One By Mouth Daily 2)  Chantix Continuing Month Pak 1 Mg Tabs (Varenicline Tartrate) .... 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 elevated LFTs Hx of ETOH abuse Depression  Past Surgical History: Lumbar fusion  Family History: Reviewed history from 04/09/2008 and no changes required. Family History Hypertension NHL  Social History: Reviewed history from 04/09/2008 and no changes required. Media planner Occupation: Oceanographer Current Smoker Alcohol use-no Drug use-no Regular exercise-yes Smoking Status:  current Drug Use:  never, no Does Patient Exercise:  yes Packs/Day:  0.5 Hepatitis Risk:  no risk noted HIV Risk:  no risk noted STD Risk:  no risk noted Dental Care w/in 6 mos.:  yes Sun Exposure-Excessive:  yes Seat Belt Use:  yes Sexual History:  currently monogamous Blood Transfusions:  no  Review of Systems  The patient denies anorexia, fever, weight loss, weight gain, chest pain, syncope, dyspnea on exertion, peripheral edema, prolonged cough, headaches, hemoptysis, abdominal pain, hematuria, genital sores, suspicious skin lesions, difficulty walking, depression, abnormal bleeding, enlarged lymph nodes, angioedema, and testicular masses.   GU:  Denies discharge, dysuria, erectile dysfunction, genital sores, hematuria, nocturia, urinary frequency, and urinary hesitancy. Psych:  Denies alternate hallucination (  auditory/visual), anxiety, depression, easily angered, easily tearful, irritability, mental problems, panic attacks, sense of great danger, suicidal thoughts/plans, thoughts of violence, unusual visions or sounds, and thoughts /plans of harming others.  Physical Exam  General:  alert, well-developed, well-nourished, well-hydrated, appropriate dress, normal appearance, healthy-appearing, cooperative to examination, and good hygiene.   Head:  normocephalic, atraumatic, no abnormalities observed, and no abnormalities palpated.   Eyes:  vision grossly intact, pupils equal, and pupils round.   Ears:  R ear normal and L ear normal.   Nose:  External nasal examination shows no deformity or inflammation. Nasal mucosa are pink and moist without lesions or exudates. Mouth:  Oral mucosa and oropharynx without lesions or exudates.  Teeth in good repair. Neck:  supple, full ROM, no masses, no thyromegaly, no thyroid nodules or tenderness, no JVD, normal carotid upstroke, no carotid bruits, no cervical lymphadenopathy, and no neck tenderness.   Breasts:  no gynecomastia, no masses, no adenopathy, no swelling, no nipple discharge, no tenderness, and no inflammation.   Lungs:  normal respiratory effort, no intercostal retractions, no accessory muscle use, normal breath sounds, no dullness, no fremitus, no crackles, and no wheezes.   Heart:  normal rate, regular rhythm, no murmur, no gallop, no rub, and no JVD.   Abdomen:  soft, non-tender, normal bowel sounds, no distention, no masses, no guarding, no rigidity, no rebound tenderness, no abdominal hernia, no inguinal hernia, no hepatomegaly, no splenomegaly, and abdominal scar(s).   Genitalia:  circumcised, no hydrocele, no varicocele, no scrotal masses, no testicular masses or atrophy, no cutaneous lesions, and no urethral discharge.   Msk:  normal ROM, no joint tenderness, no joint swelling, no joint warmth, no redness over joints, no joint deformities, no joint  instability, no crepitation, and no muscle atrophy.   Pulses:  R and L carotid,radial,femoral,dorsalis pedis and posterior tibial pulses are full and equal bilaterally Extremities:  No clubbing, cyanosis, edema, or deformity noted with normal full range of motion of all joints.   Neurologic:  No cranial nerve deficits noted. Station and gait are normal. Plantar reflexes are down-going bilaterally. DTRs are symmetrical throughout. Sensory, motor and coordinative functions appear intact. Skin:  turgor normal, no rashes, no suspicious lesions, no ecchymoses, no petechiae, no purpura, no ulcerations, no edema, tattoo(s), excessive tan, and solar damage.   Cervical Nodes:  no anterior cervical adenopathy and no posterior cervical adenopathy.   Axillary Nodes:  no R axillary adenopathy and no L axillary adenopathy.   Inguinal Nodes:  no R inguinal adenopathy and no L inguinal adenopathy.   Psych:  Cognition and judgment appear intact. Alert and cooperative with normal attention span and concentration. No apparent delusions, illusions, hallucinations   Impression & Recommendations:  Problem # 1:  PHYSICAL EXAMINATION, NORMAL (ICD-V70.0) Assessment Unchanged  Orders: Venipuncture (16109) T-HIV Antibody  (Reflex) ([id number]) TLB-Lipid Panel (80061-LIPID)  Td Booster: Historical (01/02/2007)   Flu Vax: Fluvax 3+ (03/23/2010)   Chol: 206 (03/31/2008)   HDL: 49.0 (03/31/2008)   LDL: DEL (03/31/2008)   TG: 49 (03/31/2008) TSH: 1.27 (03/31/2008)    Discussed using sunscreen, use of alcohol, drug use, self testicular exam, routine dental care, routine eye care, routine physical exam, seat belts, multiple vitamins,  and recommendations for immunizations.  Discussed exercise and checking cholesterol.  Discussed  safe sex.  Complete Medication List: 1)  Venlafaxine Hcl 150 Mg Xr24h-tab (Venlafaxine hcl) .... One by mouth daily 2)  Chantix Continuing Month Pak 1 Mg Tabs (Varenicline tartrate) .... One by  mouth bid  Other Orders: Admin 1st Vaccine (91478) Flu Vaccine 67yrs + (29562)  Patient Instructions: 1)  Please schedule a follow-up appointment in 6 months. 2)  Tobacco is very bad for your health and your loved ones! You Should stop smoking!. 3)  Stop Smoking Tips: Choose a Quit date. Cut down before the Quit date. decide what you will do as a substitute when you feel the urge to smoke(gum,toothpick,exercise). 4)  It is important that you exercise regularly at least 20 minutes 5 times a week. If you develop chest pain, have severe difficulty breathing, or feel very tired , stop exercising immediately and seek medical  attention. 5)  You need to lose weight. Consider a lower calorie diet and regular exercise.  6)  If you could be exposed to sexually transmitted diseases, you should use a condom. 7)  Take an Aspirin every day. Prescriptions: VENLAFAXINE HCL 150 MG XR24H-TAB (VENLAFAXINE HCL) one by mouth daily  #30 x 11   Entered and Authorized by:   Etta Grandchild MD   Signed by:   Etta Grandchild MD on 03/24/2010   Method used:   Electronically to        [facility]* (retail)       [address]       [place], [place]  [id number]       Ph: [phone]       Fax: [phone]   RxID:   [id number] CHANTIX CONTINUING MONTH PAK 1 MG TABS (VARENICLINE TARTRATE) One by mouth BID  #60 x 4   Entered and Authorized by:   Etta Grandchild MD   Signed by:   Etta Grandchild MD on 03/23/2010   Method used:   Electronically to        [facility]* (retail)       [address]       [place], [place]  [id number]       Ph: [phone]       Fax: [phone]   RxID:   [id number]    Immunization History:  Tetanus/Td Immunization History:    Tetanus/Td:  historical (01/02/2007)  Flu Vaccine Consent Questions     Do you have a history of severe allergic reactions to this vaccine? no    Any prior history of allergic reactions to egg and/or gelatin? no     Do you have a sensitivity to the preservative Thimersol? no    Do you have a past history of Guillan-Barre Syndrome? no    Do you currently have an acute febrile illness? no    Have you ever had a severe reaction to latex? no    Vaccine information given and explained to patient? yes    Are you currently pregnant? no    Lot Number:[id number]   Exp Date:01/01/2011   Site Given  Left Deltoid IMbflu ............ Lamar Sprinkles, CMA  March 23, 2010 10:40 AM

## 2010-08-04 NOTE — Progress Notes (Signed)
Summary: chantix refill  Phone Note Refill Request Message from:  Fax from Pharmacy on October 09, 2009 9:47 AM  Refills Requested: Medication #1:  CHANTIX CONTINUING MONTH PAK 1 MG TABS take as directed. Initial call taken by: Kern Reap CMA Duncan Dull),  October 09, 2009 9:47 AM    Prescriptions: CHANTIX CONTINUING MONTH PAK 1 MG TABS (VARENICLINE TARTRATE) take as directed  #1 pak x 1   Entered by:   Kern Reap CMA (AAMA)   Authorized by:   Stacie Glaze MD   Signed by:   Kern Reap CMA (AAMA) on 10/09/2009   Method used:   Electronically to        [facility]* (retail)       [address]       [place], [place]  [address]       Ph: [phone]       Fax: [phone]   RxID:   [id number]

## 2010-09-05 ENCOUNTER — Telehealth: Payer: Self-pay | Admitting: Internal Medicine

## 2010-09-05 DIAGNOSIS — N342 Other urethritis: Secondary | ICD-10-CM | POA: Insufficient documentation

## 2010-09-06 ENCOUNTER — Other Ambulatory Visit: Payer: Self-pay | Admitting: Internal Medicine

## 2010-09-06 ENCOUNTER — Other Ambulatory Visit: Payer: Self-pay

## 2010-09-06 ENCOUNTER — Encounter: Payer: Self-pay | Admitting: Internal Medicine

## 2010-09-06 ENCOUNTER — Encounter (INDEPENDENT_AMBULATORY_CARE_PROVIDER_SITE_OTHER): Payer: Self-pay | Admitting: *Deleted

## 2010-09-06 DIAGNOSIS — Z202 Contact with and (suspected) exposure to infections with a predominantly sexual mode of transmission: Secondary | ICD-10-CM

## 2010-09-06 LAB — URINALYSIS, ROUTINE W REFLEX MICROSCOPIC
Leukocytes, UA: NEGATIVE
Nitrite: NEGATIVE
Specific Gravity, Urine: <=1.005 (ref 1.000–1.030)

## 2010-09-06 LAB — CONVERTED CEMR LAB
Chlamydia, Swab/Urine, PCR: NEGATIVE
GC Probe Amp, Urine: NEGATIVE

## 2010-09-07 ENCOUNTER — Encounter: Payer: Self-pay | Admitting: Internal Medicine

## 2010-09-14 NOTE — Progress Notes (Signed)
  Phone Note Other Incoming   Summary of Call: pt called c/o painful urination and concerned he has an STD  New Problems: OTHER URETHRITIS (ICD-597.89)   New Problems: OTHER URETHRITIS (ICD-597.89)  Appended Document:  labs entered in Epic/la

## 2010-10-13 LAB — COMPREHENSIVE METABOLIC PANEL
ALT: 59 U/L — ABNORMAL HIGH (ref 0–53)
AST: 51 U/L — ABNORMAL HIGH (ref 0–37)
Albumin: 4.2 g/dL (ref 3.5–5.2)
BUN: 9 mg/dL (ref 6–23)
Chloride: 107 mEq/L (ref 96–112)
Creatinine, Ser: 0.76 mg/dL (ref 0.4–1.5)
GFR calc Af Amer: >60 mL/min (ref >60)
Glucose, Bld: 74 mg/dL (ref 70–99)
Potassium: 4.5 mEq/L (ref 3.5–5.1)
Total Protein: 6.7 g/dL (ref 6.0–8.3)

## 2010-10-13 LAB — GLUCOSE, CAPILLARY: Glucose-Capillary: 91 mg/dL (ref 70–99)

## 2010-10-13 LAB — CBC
HCT: 36.7 % — ABNORMAL LOW (ref 39.0–52.0)
HCT: 45 % (ref 39.0–52.0)
MCHC: 34.6 g/dL (ref 30.0–36.0)
MCV: 87.3 fL (ref 78.0–100.0)
Platelets: 160 10*3/uL (ref 150–400)
RBC: 4.16 MIL/uL — ABNORMAL LOW (ref 4.22–5.81)
RDW: 13.3 % (ref 11.5–15.5)
RDW: 13.3 % (ref 11.5–15.5)
WBC: 8.6 10*3/uL (ref 4.0–10.5)

## 2010-10-13 LAB — BASIC METABOLIC PANEL
BUN: 7 mg/dL (ref 6–23)
Creatinine, Ser: 0.75 mg/dL (ref 0.4–1.5)
Glucose, Bld: 91 mg/dL (ref 70–99)

## 2010-10-13 LAB — TYPE AND SCREEN: ABO/RH(D): O POS

## 2010-10-17 ENCOUNTER — Telehealth: Payer: Self-pay | Admitting: Internal Medicine

## 2010-10-17 MED ORDER — VARENICLINE TARTRATE 0.5 MG X 11 & 1 MG X 42 PO MISC: ORAL | Status: AC

## 2010-10-17 MED ORDER — VARENICLINE TARTRATE 1 MG PO TABS: 1.0000 mg | ORAL_TABLET | Freq: Two times a day (BID) | ORAL | Status: DC

## 2010-10-17 NOTE — Telephone Encounter (Signed)
Pt called and requested an Rx for chnatix

## 2010-11-16 NOTE — Op Note (Signed)
NAMELEVONTE, Ethan Crosby NO.:  [id number]   MEDICAL RECORD NO.:  [id number]          PATIENT TYPE:  INP   LOCATION:  3027                         FACILITY:  [facility]   PHYSICIAN:  Cristi Loron, M.D.DATE OF BIRTH:  Feb 03, 1966   DATE OF PROCEDURE:  10/27/2008  DATE OF DISCHARGE:                               OPERATIVE REPORT   BRIEF HISTORY:  The patient is a 45 year old white male who suffered  from chronic back and leg pain.  He has failed extensive medical  management and was worked up with lumbar MRI which demonstrated the  patient had L4-5 disk degeneration.  I discussed the various treatment  with the patient including surgery.  He has weighed the risks, benefits,  and alternatives of surgery and decided to proceed with an L4-5 anterior  lumbar interbody fusion, placement of interbody prosthesis, and anterior  instrumentation.   PREOPERATIVE DIAGNOSES:  L4-5 degeneration, lumbago, lumbar  radiculopathy.   POSTOPERATIVE DIAGNOSES:  L4-5 degeneration, lumbago, lumbar  radiculopathy.   PROCEDURE:  L4-5 anterior lumbar interbody fusion; insertion of L4-5  interbody prosthesis (Synthes SynFix anterior interbody prosthesis);  anterior lumbar instrumentation with SynFix bone screws.   SURGEON:  Cristi Loron, MD/Todd Shirline Frees, MD   ANESTHESIA:  General endotracheal.   ESTIMATED BLOOD LOSS:  100 mL.   SPECIMENS:  None.   DRAINS:  None.   COMPLICATIONS:  None.   DESCRIPTION OF PROCEDURE:  The patient was brought to the operating room  by the anesthesia team.  General endotracheal anesthesia was induced.  The patient remained in the supine position..  Dr. Arbie Cookey performed the  access surgery and the closure.  For full details of this part of the  operation, please refer to his typed operative note.   Once Dr. Arbie Cookey had provided exposure of the L4-5, intervertebral disk,  and placed retractors, I took over the operation by incising the L4-5  intervertebral disk with 15 blade scalpel.  We performed an  intervertebral dissectomy using pituitary forceps and various curettes.  We prepared the vertebral endplates for the posterior lumbar interbody  fusion by using the curettes to remove soft tissues from the vertebral  endplates.  We then used a rasp to further clear the soft tissues from  the end plates.  We then used trial spacers and determined to use a  SynFix 30-mm depth, 8-mm of width, and 50-mm height 8-degree lordosis  prosthesis.  We prefilled this prosthesis with a combination of Actifuse  bone graft extender as well as bone morphogenic protein soaked collagen  sponges.  We then placed prosthesis into the interspace and we confirmed  this position in both AP and lateral plane.  We then used the awl to  place the anterior screws to the prosthesis (20 mm screws) to L4-L5.  There was good snug fit of prosthesis.  We then obtained hemostasis  using electrocautery.  We irrigated the wound out with bacitracin  solution and at this point Dr. Arbie Cookey took over the closure.  Please  refer his operative for further details.   The patient was subsequently transferred to  the Postanesthesia Care Unit  in stable condition.  All sponge, instrument, and needle counts were  correct at the end of this case.      Cristi Loron, M.D.  Electronically Signed     JDJ/MEDQ  D:  10/27/2008  T:  10/28/2008  Job:  [id number]

## 2010-11-16 NOTE — Op Note (Signed)
Ethan Crosby, Ethan Crosby NO.:  [id number]   MEDICAL RECORD NO.:  [id number]          PATIENT TYPE:  INP   LOCATION:  3027                         FACILITY:  [facility]   PHYSICIAN:  Larina Earthly, M.D.    DATE OF BIRTH:  Mar 14, 1966   DATE OF PROCEDURE:  DATE OF DISCHARGE:                               OPERATIVE REPORT   PREOPERATIVE /DIAGNOSIS:  Lumbar disk disease L4-L5.   POSTOPERATIVE DIAGNOSIS:  Lumbar disk disease L4-L5.   PROCEDURE:  Anterior exposure for anterior lumbar interbody fusion which  will be dictated as a separate note by Dr. Tressie Stalker.   SURGEON:  Larina Earthly, MD   ANESTHESIA:  General endotracheal.   COMPLICATIONS:  None.   DISPOSITION:  To recovery room, stable.   INDICATIONS FOR THE PROCEDURE:  The patient is a 45 year old gentleman  who was seen in preoperative consultation by Dr. Tressie Stalker who  recommended a L4-5 ALIF.  I discussed this with Ethan Crosby in the  preoperative holding area to explain my role for anterior exposure.  I  explained the procedure and potential complications including injury to  the ureter, artery, vein, and intra-abdominal contents.  The patient  understands the procedure and wished to proceed with surgery.   PROCEDURE IN DETAIL:  The patient was taken to the operating room,  placed in supine position.  With the C-arm in the lateral position, the  level of the L4-5 body was marked on the skin.  The patient's abdomen  was prepped and draped in the usual sterile fashion.  Transverse  incision was made at the level of L4-5 just at the left of midline.  The  incision was continued down through the subcutaneous tissue to the level  of the fat, which was mobilized off the anterior sheath.  The anterior  rectus sheath was opened underlying with the skin incision and the  rectus muscle was mobilized to the lateral left.  The retroperitoneum  was entered below the semilunar line and the posterior rectus  sheath was  opened with electrocautery laterally.  The peritoneal contents were  mobilized to the right.  The ureter and left iliac artery and vein were  identified.  These were all mobilized off the spine to the right.  The  lumbar vein was identified at an early branch and both the proximal and  both distal branches were ligated with 2-0 silk ties and divided.  This  gave good mobilization over to the vessels for exposure to the L4-5  body.  The Brau retractor brought on to the field and the 150 blade were  used to the right of the spinous process and to the left.  This gave  good visualization of L4-5.  There were several entries into the  peritoneum.  The interbody fusion was then undertaken, which will be  dictated as a separate note by Dr. Lovell Crosby.  At the completion of the  procedure, the retractors were removed.  The patient did have a  relatively large defect in the peritoneum.  There was some bruising of  the small bowel at  one point from retraction with a partial thickness  tear in the serosa.  This was repaired with several interrupted 3-0  Vicryl sutures.  The retroperitoneum was closed with a running 3-0  Vicryl suture.  This was then allowed to retract back into its normal  position.  The wounds were irrigated with antibiotic solution and the  anterior  sheath was closed with a running 0 Vicryl suture beginning medially and  laterally, and tying in the middle.  The wound again was irrigated.  The  skin was closed with a 3-0 subcuticular Vicryl stitch, DuoDerm was  applied.  Sterile dressing was applied and the patient was taken to the  recovery room in stable condition.      Larina Earthly, M.D.  Electronically Signed     TFE/MEDQ  D:  10/27/2008  T:  10/28/2008  Job:  [id number]   cc:   Cristi Loron, M.D.

## 2010-11-16 NOTE — Discharge Summary (Signed)
NAMETAVARES, LEVINSON NO.:  [id number]   MEDICAL RECORD NO.:  [id number]          PATIENT TYPE:  INP   LOCATION:  3020                         FACILITY:  [facility]   PHYSICIAN:  Cristi Loron, M.D.DATE OF BIRTH:  Jan 03, 1966   DATE OF ADMISSION:  10/27/2008  DATE OF DISCHARGE:  10/30/2008                               DISCHARGE SUMMARY   BRIEF HISTORY:  The patient is a 45 year old white male who has suffered  from chronic back and leg pain.  He has failed extensive medical  management and was worked up with a lumbar MRI, which demonstrated the  patient had L4-5 disk degeneration.  I discussed the various treatment  options with the patient including surgery.  The patient has weighed the  risks, benefits, and alternatives of surgery and decided to proceed with  an L4-5 anterior lumbar interbody fusion, placement of interbody  prosthesis, and anterior instrumentation.   For further details of this admission, please refer our typed history  and physical.   HOSPITAL COURSE:  Admitted the patient to [facility] on October 27, 2008.  On the day of admission, we performed an L4-5 anterior lumbar  interbody fusion and placement of interbody prosthesis with anterior  instrumentation.  Surgery went well.  There was a partial thickness tear  in the surface of the small bowel, which Dr. Arbie Cookey repaired with several  interrupted 3-0 Vicryl sutures (for full details of the operation,  please refer to Dr. Arbie Cookey and my typed operative note).   The patient's postoperative course is unremarkable.  He remained  afebrile.  Vitals were stable and by postop day #3, he was eating well,  ambulating well, and was requesting discharge to home.   The patient was discharged home on October 30, 2008.   At the time of discharge, the patient's wound was minimally erythematous  and we thought it would be prudent to start him on Keflex, although  there was no clear wound infection.   DISCHARGE INSTRUCTIONS:  The patient was given written discharge  instructions about followup with me in 4 weeks.   DISCHARGE PRESCRIPTION:  1. Percocet 10/325, #100 one p.o. q.4 h. p.r.n. for pain.  2. Valium 5 mg, #50 one p.o. q.6 h. p.r.n. for pain.  3. Keflex 500 mg, #28 one p.o. q.6 h.   FINAL DIAGNOSES:  1. L4-5 degenerative disease.  2. Lumbago.  3. Lumbar radiculopathy.   PROCEDURES PERFORMED:  L4-5 anterior lumbar interbody fusion; insertion  of L4-5 interbody prosthesis (Synthes SynFix anterior interbody  prosthesis); anterior lumbar instrumentation with SynFix titanium bone  screws.      Cristi Loron, M.D.  Electronically Signed     JDJ/MEDQ  D:  11/19/2008  T:  11/19/2008  Job:  [id number]

## 2011-03-18 ENCOUNTER — Other Ambulatory Visit: Payer: Self-pay | Admitting: Internal Medicine

## 2011-05-17 ENCOUNTER — Other Ambulatory Visit: Payer: Self-pay | Admitting: Internal Medicine

## 2011-05-17 MED ORDER — VARENICLINE TARTRATE 0.5 MG X 11 & 1 MG X 42 PO MISC: ORAL | Status: DC

## 2011-05-17 MED ORDER — VARENICLINE TARTRATE 1 MG PO TABS: 1.0000 mg | ORAL_TABLET | Freq: Two times a day (BID) | ORAL | Status: AC

## 2012-01-18 ENCOUNTER — Telehealth: Payer: Self-pay | Admitting: Internal Medicine

## 2012-01-18 MED ORDER — METHYLPREDNISOLONE 4 MG PO KIT: PACK | ORAL | Status: AC

## 2012-01-18 NOTE — Telephone Encounter (Signed)
Pt called and said he went to the beach last weekend and got a severe sunburn and now has itching and skin peeling, he has tried otc benadryl and cortaid cream without much relief, will offer medrol dose pak for symptom relief

## 2012-01-31 ENCOUNTER — Other Ambulatory Visit: Payer: Self-pay | Admitting: Internal Medicine

## 2012-01-31 MED ORDER — VENLAFAXINE HCL ER 75 MG PO CP24: 75.0000 mg | ORAL_CAPSULE | Freq: Every day | ORAL | Status: DC

## 2012-04-30 ENCOUNTER — Telehealth: Payer: Self-pay | Admitting: Internal Medicine

## 2012-04-30 MED ORDER — VARENICLINE TARTRATE 1 MG PO TABS: 1.0000 mg | ORAL_TABLET | Freq: Two times a day (BID) | ORAL | Status: DC

## 2012-04-30 MED ORDER — VARENICLINE TARTRATE 0.5 MG X 11 & 1 MG X 42 PO MISC: ORAL | Status: AC

## 2012-04-30 NOTE — Telephone Encounter (Signed)
done

## 2012-04-30 NOTE — Telephone Encounter (Signed)
Pt called asking for a refill on chantix.

## 2012-07-16 ENCOUNTER — Other Ambulatory Visit: Payer: Self-pay | Admitting: Internal Medicine

## 2012-07-16 MED ORDER — VENLAFAXINE HCL ER 75 MG PO CP24: 75.0000 mg | ORAL_CAPSULE | Freq: Every day | ORAL | Status: DC

## 2012-07-18 ENCOUNTER — Encounter: Payer: Self-pay | Admitting: Internal Medicine

## 2012-07-18 ENCOUNTER — Other Ambulatory Visit (INDEPENDENT_AMBULATORY_CARE_PROVIDER_SITE_OTHER): Payer: 59

## 2012-07-18 ENCOUNTER — Ambulatory Visit (INDEPENDENT_AMBULATORY_CARE_PROVIDER_SITE_OTHER): Payer: 59 | Admitting: Internal Medicine

## 2012-07-18 VITALS — BP 128/76 | HR 78 | Temp 97.4°F | Resp 16 | Ht 67.0 in | Wt 198.0 lb

## 2012-07-18 DIAGNOSIS — Z0000 Encounter for general adult medical examination without abnormal findings: Secondary | ICD-10-CM

## 2012-07-18 DIAGNOSIS — M659 Synovitis and tenosynovitis, unspecified: Secondary | ICD-10-CM

## 2012-07-18 DIAGNOSIS — K4020 Bilateral inguinal hernia, without obstruction or gangrene, not specified as recurrent: Secondary | ICD-10-CM

## 2012-07-18 LAB — CBC WITH DIFFERENTIAL/PLATELET
Basophils Absolute: 0.1 10*3/uL (ref 0.0–0.1)
Eosinophils Absolute: 0.6 10*3/uL (ref 0.0–0.7)
MCHC: 33.7 g/dL (ref 30.0–36.0)
MCV: 86.2 fl (ref 78.0–100.0)
Monocytes Absolute: 0.7 10*3/uL (ref 0.1–1.0)
Neutrophils Relative %: 46.4 % (ref 43.0–77.0)
Platelets: 209 10*3/uL (ref 150.0–400.0)
RDW: 13.6 % (ref 11.5–14.6)

## 2012-07-18 LAB — LIPID PANEL
LDL Cholesterol: 131 mg/dL — ABNORMAL HIGH (ref 0–99)
VLDL: 8 mg/dL (ref 0.0–40.0)

## 2012-07-18 LAB — URINALYSIS, ROUTINE W REFLEX MICROSCOPIC
Hgb urine dipstick: NEGATIVE
Leukocytes, UA: NEGATIVE
Urine Glucose: NEGATIVE
Urobilinogen, UA: 0.2 (ref 0.0–1.0)

## 2012-07-18 LAB — COMPREHENSIVE METABOLIC PANEL
AST: 42 U/L — ABNORMAL HIGH (ref 0–37)
Albumin: 4.2 g/dL (ref 3.5–5.2)
Alkaline Phosphatase: 66 U/L (ref 39–117)
Chloride: 107 mEq/L (ref 96–112)
Potassium: 4.4 mEq/L (ref 3.5–5.1)
Sodium: 140 mEq/L (ref 135–145)
Total Protein: 7.1 g/dL (ref 6.0–8.3)

## 2012-07-18 LAB — TSH: TSH: 1.01 u[IU]/mL (ref 0.35–5.50)

## 2012-07-18 MED ORDER — CELECOXIB 200 MG PO CAPS: 200.0000 mg | ORAL_CAPSULE | Freq: Every day | ORAL | Status: DC

## 2012-07-18 NOTE — Progress Notes (Signed)
Subjective:    Patient ID: Ethan Crosby, male    DOB: Oct 27, 1965, 47 y.o.   MRN: [id number]  Abdominal Pain This is a recurrent problem. The current episode started 1 to 4 weeks ago. The onset quality is gradual. The problem occurs intermittently. The most recent episode lasted 3 weeks. The problem has been gradually improving. The pain is located in the LLQ. The pain is at a severity of 1/10. The pain is mild. The quality of the pain is aching. The abdominal pain does not radiate. Associated symptoms include arthralgias (right hand). Pertinent negatives include no anorexia, belching, constipation, diarrhea, dysuria, fever, flatus, frequency, headaches, hematochezia, hematuria, melena, myalgias, nausea, vomiting or weight loss. The pain is aggravated by movement. The pain is relieved by nothing. He has tried nothing for the symptoms.      Review of Systems  Constitutional: Negative for fever, chills, weight loss, diaphoresis, activity change, appetite change, fatigue and unexpected weight change.  HENT: Negative.   Eyes: Negative.   Respiratory: Negative for cough, chest tightness, shortness of breath, wheezing and stridor.   Cardiovascular: Negative for chest pain, palpitations and leg swelling.  Gastrointestinal: Positive for abdominal pain. Negative for nausea, vomiting, diarrhea, constipation, blood in stool, melena, hematochezia, abdominal distention, anal bleeding, rectal pain, anorexia and flatus.  Genitourinary: Negative for dysuria, urgency, frequency, hematuria, flank pain, decreased urine volume, discharge, penile swelling, scrotal swelling, enuresis, difficulty urinating, genital sores, penile pain and testicular pain.  Musculoskeletal: Positive for arthralgias (right hand). Negative for myalgias, back pain, joint swelling and gait problem.  Skin: Negative for color change, pallor, rash and wound.  Neurological: Negative for dizziness, tremors, seizures, syncope, facial  asymmetry, speech difficulty, weakness, light-headedness, numbness and headaches.  Hematological: Negative for adenopathy. Does not bruise/bleed easily.  Psychiatric/Behavioral: Negative.        Objective:   Physical Exam  Vitals reviewed. Constitutional: He is oriented to person, place, and time. He appears well-developed and well-nourished. No distress.  HENT:  Head: Normocephalic and atraumatic.  Mouth/Throat: Oropharynx is clear and moist. No oropharyngeal exudate.  Eyes: Conjunctivae normal are normal. Right eye exhibits no discharge. Left eye exhibits no discharge. No scleral icterus.  Neck: Normal range of motion. Neck supple. No JVD present. No tracheal deviation present. No thyromegaly present.  Cardiovascular: Normal rate, regular rhythm, normal heart sounds and intact distal pulses.  Exam reveals no gallop and no friction rub.   No murmur heard. Pulmonary/Chest: Effort normal and breath sounds normal. No stridor. No respiratory distress. He has no wheezes. He has no rales. He exhibits no tenderness.  Abdominal: Soft. Normal appearance and bowel sounds are normal. He exhibits no shifting dullness, no distension, no pulsatile liver, no fluid wave, no abdominal bruit, no ascites, no pulsatile midline mass and no mass. There is no hepatosplenomegaly, splenomegaly or hepatomegaly. There is no tenderness. There is no rigidity, no rebound, no guarding, no CVA tenderness, no tenderness at McBurney's point and negative Murphy's sign. A hernia is present. Hernia confirmed positive in the right inguinal area (direct) and confirmed positive in the left inguinal area (direct). Hernia confirmed negative in the ventral area.  Genitourinary: Testes normal and penis normal. Right testis shows no mass, no swelling and no tenderness. Right testis is descended. Left testis shows no mass, no swelling and no tenderness. Left testis is descended. Circumcised. No penile erythema or penile tenderness. No  discharge found.  Musculoskeletal: Normal range of motion. He exhibits no edema and no tenderness.  Right hand: He exhibits tenderness. He exhibits normal range of motion, no bony tenderness, normal two-point discrimination, normal capillary refill, no deformity, no laceration and no swelling. normal sensation noted. Normal strength noted.       Hands: Lymphadenopathy:    He has no cervical adenopathy.       Right: No inguinal adenopathy present.       Left: No inguinal adenopathy present.  Neurological: He is oriented to person, place, and time.  Skin: Skin is warm and dry. No rash noted. He is not diaphoretic. No erythema. No pallor.  Psychiatric: He has a normal mood and affect. His behavior is normal. Judgment and thought content normal.     Lab Results  Component Value Date   WBC 10.8* 10/28/2008   HGB 12.6* 10/28/2008   HCT 36.7* 10/28/2008   PLT 160 10/28/2008   GLUCOSE 91 10/28/2008   CHOL 189 03/23/2010   TRIG 26.0 03/23/2010   HDL 47.80 03/23/2010   LDLDIRECT 146.8 03/31/2008   LDLCALC 136* 03/23/2010   ALT 59* 10/21/2008   AST 51* 10/21/2008   NA 137 10/28/2008   K 3.5 10/28/2008   CL 104 10/28/2008   CREATININE 0.75 10/28/2008   BUN 7 10/28/2008   CO2 26 10/28/2008   TSH 1.27 03/31/2008       Assessment & Plan:

## 2012-07-18 NOTE — Assessment & Plan Note (Signed)
He will try celebrex/rest/ice If this does not improve he will see a hand surgeon for other treatment options

## 2012-07-18 NOTE — Assessment & Plan Note (Signed)
Exam done Vaccines were reviewed Labs ordered Pt ed material was given 

## 2012-07-18 NOTE — Patient Instructions (Signed)
Abdominal Pain Abdominal pain can be caused by many things. Your caregiver decides the seriousness of your pain by an examination and possibly blood tests and X-rays. Many cases can be observed and treated at home. Most abdominal pain is not caused by a disease and will probably improve without treatment. However, in many cases, more time must pass before a clear cause of the pain can be found. Before that point, it may not be known if you need more testing, or if hospitalization or surgery is needed. HOME CARE INSTRUCTIONS   Do not take laxatives unless directed by your caregiver.  Take pain medicine only as directed by your caregiver.  Only take over-the-counter or prescription medicines for pain, discomfort, or fever as directed by your caregiver.  Try a clear liquid diet (broth, tea, or water) for as long as directed by your caregiver. Slowly move to a bland diet as tolerated. SEEK IMMEDIATE MEDICAL CARE IF:   The pain does not go away.  You have a fever.  You keep throwing up (vomiting).  The pain is felt only in portions of the abdomen. Pain in the right side could possibly be appendicitis. In an adult, pain in the left lower portion of the abdomen could be colitis or diverticulitis.  You pass bloody or black tarry stools. MAKE SURE YOU:   Understand these instructions.  Will watch your condition.  Will get help right away if you are not doing well or get worse. Document Released: 03/30/2005 Document Revised: 09/12/2011 Document Reviewed: 02/06/2008 [facility] Patient Information 2013 [place], [place]. Health Maintenance, Males A healthy lifestyle and preventative care can promote health and wellness.  Maintain regular health, dental, and eye exams.  Eat a healthy diet. Foods like vegetables, fruits, whole grains, low-fat dairy products, and lean protein foods contain the nutrients you need without too many calories. Decrease your intake of foods high in solid fats, added  sugars, and salt. Get information about a proper diet from your caregiver, if necessary.  Regular physical exercise is one of the most important things you can do for your health. Most adults should get at least 150 minutes of moderate-intensity exercise (any activity that increases your heart rate and causes you to sweat) each week. In addition, most adults need muscle-strengthening exercises on 2 or more days a week.   Maintain a healthy weight. The body mass index (BMI) is a screening tool to identify possible weight problems. It provides an estimate of body fat based on height and weight. Your caregiver can help determine your BMI, and can help you achieve or maintain a healthy weight. For adults 20 years and older:  A BMI below 18.5 is considered underweight.  A BMI of 18.5 to 24.9 is normal.  A BMI of 25 to 29.9 is considered overweight.  A BMI of 30 and above is considered obese.  Maintain normal blood lipids and cholesterol by exercising and minimizing your intake of saturated fat. Eat a balanced diet with plenty of fruits and vegetables. Blood tests for lipids and cholesterol should begin at age 68 and be repeated every 5 years. If your lipid or cholesterol levels are high, you are over 50, or you are a high risk for heart disease, you may need your cholesterol levels checked more frequently.Ongoing high lipid and cholesterol levels should be treated with medicines, if diet and exercise are not effective.  If you smoke, find out from your caregiver how to quit. If you do not use tobacco, do not  start.  If you choose to drink alcohol, do not exceed 2 drinks per day. One drink is considered to be 12 ounces (355 mL) of beer, 5 ounces (148 mL) of wine, or 1.5 ounces (44 mL) of liquor.  Avoid use of street drugs. Do not share needles with anyone. Ask for help if you need support or instructions about stopping the use of drugs.  High blood pressure causes heart disease and increases the  risk of stroke. Blood pressure should be checked at least every 1 to 2 years. Ongoing high blood pressure should be treated with medicines if weight loss and exercise are not effective.  If you are 40 to 47 years old, ask your caregiver if you should take aspirin to prevent heart disease.  Diabetes screening involves taking a blood sample to check your fasting blood sugar level. This should be done once every 3 years, after age 27, if you are within normal weight and without risk factors for diabetes. Testing should be considered at a younger age or be carried out more frequently if you are overweight and have at least 1 risk factor for diabetes.  Colorectal cancer can be detected and often prevented. Most routine colorectal cancer screening begins at the age of 22 and continues through age 72. However, your caregiver may recommend screening at an earlier age if you have risk factors for colon cancer. On a yearly basis, your caregiver may provide home test kits to check for hidden blood in the stool. Use of a small camera at the end of a tube, to directly examine the colon (sigmoidoscopy or colonoscopy), can detect the earliest forms of colorectal cancer. Talk to your caregiver about this at age 94, when routine screening begins. Direct examination of the colon should be repeated every 5 to 10 years through age 61, unless early forms of pre-cancerous polyps or small growths are found.  Hepatitis C blood testing is recommended for all people born from [birth year] through 1965 and any individual with known risks for hepatitis C.  Healthy men should no longer receive prostate-specific antigen (PSA) blood tests as part of routine cancer screening. Consult with your caregiver about prostate cancer screening.  Testicular cancer screening is not recommended for adolescents or adult males who have no symptoms. Screening includes self-exam, caregiver exam, and other screening tests. Consult with your caregiver about any  symptoms you have or any concerns you have about testicular cancer.  Practice safe sex. Use condoms and avoid high-risk sexual practices to reduce the spread of sexually transmitted infections (STIs).  Use sunscreen with a sun protection factor (SPF) of 30 or greater. Apply sunscreen liberally and repeatedly throughout the day. You should seek shade when your shadow is shorter than you. Protect yourself by wearing long sleeves, pants, a wide-brimmed hat, and sunglasses year round, whenever you are outdoors.  Notify your caregiver of new moles or changes in moles, especially if there is a change in shape or color. Also notify your caregiver if a mole is larger than the size of a pencil eraser.  A one-time screening for abdominal aortic aneurysm (AAA) and surgical repair of large AAAs by sound wave imaging (ultrasonography) is recommended for ages 59 to 13 years who are current or former smokers.  Stay current with your immunizations. Document Released: 12/17/2007 Document Revised: 09/12/2011 Document Reviewed: 11/15/2010 [facility] Patient Information 2013 [place], [place].

## 2012-07-18 NOTE — Assessment & Plan Note (Signed)
He does not want to see a surgeon about this He will work on his core/abs and let me know of any new or worsening s/s

## 2012-09-23 ENCOUNTER — Other Ambulatory Visit: Payer: Self-pay | Admitting: Internal Medicine

## 2012-09-23 ENCOUNTER — Encounter: Payer: Self-pay | Admitting: Internal Medicine

## 2012-09-23 DIAGNOSIS — F17200 Nicotine dependence, unspecified, uncomplicated: Secondary | ICD-10-CM | POA: Insufficient documentation

## 2012-09-23 MED ORDER — VARENICLINE TARTRATE 1 MG PO TABS: 1.0000 mg | ORAL_TABLET | Freq: Two times a day (BID) | ORAL | Status: DC

## 2012-09-23 MED ORDER — VARENICLINE TARTRATE 1 MG PO TABS: 1.0000 mg | ORAL_TABLET | Freq: Two times a day (BID) | ORAL | Status: AC

## 2012-10-18 ENCOUNTER — Other Ambulatory Visit: Payer: Self-pay | Admitting: Internal Medicine

## 2012-10-18 DIAGNOSIS — B350 Tinea barbae and tinea capitis: Secondary | ICD-10-CM

## 2012-10-18 MED ORDER — CLOTRIMAZOLE-BETAMETHASONE 1-0.05 % EX CREA: TOPICAL_CREAM | Freq: Two times a day (BID) | CUTANEOUS | Status: DC

## 2012-10-18 NOTE — Progress Notes (Signed)
Pt called today c/o a 9 month history of recurrent itchy facial rash under his beard. It worsens when he shaves. Will call in lotrisone.

## 2012-12-04 ENCOUNTER — Ambulatory Visit (INDEPENDENT_AMBULATORY_CARE_PROVIDER_SITE_OTHER): Payer: Self-pay | Admitting: *Deleted

## 2012-12-04 DIAGNOSIS — Z111 Encounter for screening for respiratory tuberculosis: Secondary | ICD-10-CM

## 2012-12-06 ENCOUNTER — Encounter: Payer: Self-pay | Admitting: *Deleted

## 2013-06-04 ENCOUNTER — Other Ambulatory Visit: Payer: Self-pay

## 2013-06-04 MED ORDER — VENLAFAXINE HCL ER 75 MG PO CP24: 75.0000 mg | ORAL_CAPSULE | Freq: Every day | ORAL | Status: DC

## 2014-06-06 ENCOUNTER — Encounter: Payer: Self-pay | Admitting: Internal Medicine

## 2014-06-06 ENCOUNTER — Ambulatory Visit (INDEPENDENT_AMBULATORY_CARE_PROVIDER_SITE_OTHER): Payer: BC Managed Care – PPO | Admitting: Internal Medicine

## 2014-06-06 VITALS — BP 122/80 | HR 91 | Temp 98.1°F | Resp 16 | Ht 67.0 in | Wt 207.0 lb

## 2014-06-06 DIAGNOSIS — Z23 Encounter for immunization: Secondary | ICD-10-CM

## 2014-06-06 DIAGNOSIS — Z0000 Encounter for general adult medical examination without abnormal findings: Secondary | ICD-10-CM

## 2014-06-06 NOTE — Progress Notes (Signed)
Pre visit review using our clinic review tool, if applicable. No additional management support is needed unless otherwise documented below in the visit note. 

## 2014-06-07 NOTE — Assessment & Plan Note (Signed)
Exam done Flu vax was given Labs ordered Pt ed material was given

## 2014-06-07 NOTE — Progress Notes (Signed)
   Subjective:    Patient ID: Ethan Crosby, male    DOB: 24-Feb-1966, 48 y.o.   MRN: [id number]  HPI  He returns for a complete physical - he tells me that he feels well and offers no complaints.  Review of Systems  All other systems reviewed and are negative.      Objective:   Physical Exam  Constitutional: He is oriented to person, place, and time. He appears well-developed and well-nourished. No distress.  HENT:  Head: Normocephalic and atraumatic.  Mouth/Throat: Oropharynx is clear and moist. No oropharyngeal exudate.  Eyes: Conjunctivae are normal. Right eye exhibits no discharge. Left eye exhibits no discharge. No scleral icterus.  Neck: Normal range of motion. Neck supple. No JVD present. No tracheal deviation present. No thyromegaly present.  Cardiovascular: Normal rate, regular rhythm, normal heart sounds and intact distal pulses.  Exam reveals no gallop and no friction rub.   No murmur heard. Pulmonary/Chest: Effort normal and breath sounds normal. No stridor. No respiratory distress. He has no wheezes. He has no rales. He exhibits no tenderness.  Abdominal: Soft. Bowel sounds are normal. He exhibits no distension and no mass. There is no tenderness. There is no rebound and no guarding. Hernia confirmed negative in the right inguinal area and confirmed negative in the left inguinal area.  Genitourinary: Testes normal. Right testis shows no mass, no swelling and no tenderness. Right testis is descended. Left testis shows no mass, no swelling and no tenderness. Left testis is descended. Circumcised. No penile erythema or penile tenderness. No discharge found.  Musculoskeletal: Normal range of motion. He exhibits no edema or tenderness.  Lymphadenopathy:    He has no cervical adenopathy.       Right: No inguinal adenopathy present.       Left: No inguinal adenopathy present.  Neurological: He is oriented to person, place, and time.  Skin: Skin is warm and dry. No rash noted.  He is not diaphoretic. No erythema. No pallor.  Psychiatric: He has a normal mood and affect. His behavior is normal. Judgment and thought content normal.  Vitals reviewed.   Lab Results  Component Value Date   WBC 7.6 07/18/2012   HGB 15.0 07/18/2012   HCT 44.4 07/18/2012   PLT 209.0 07/18/2012   GLUCOSE 99 07/18/2012   CHOL 184 07/18/2012   TRIG 40.0 07/18/2012   HDL 45.20 07/18/2012   LDLDIRECT 146.8 03/31/2008   LDLCALC 131* 07/18/2012   ALT 38 07/18/2012   AST 42* 07/18/2012   NA 140 07/18/2012   K 4.4 07/18/2012   CL 107 07/18/2012   CREATININE 0.8 07/18/2012   BUN 15 07/18/2012   CO2 27 07/18/2012   TSH 1.01 07/18/2012        Assessment & Plan:

## 2014-06-09 ENCOUNTER — Other Ambulatory Visit (INDEPENDENT_AMBULATORY_CARE_PROVIDER_SITE_OTHER): Payer: BC Managed Care – PPO

## 2014-06-09 ENCOUNTER — Telehealth: Payer: Self-pay | Admitting: Internal Medicine

## 2014-06-09 DIAGNOSIS — Z0000 Encounter for general adult medical examination without abnormal findings: Secondary | ICD-10-CM

## 2014-06-09 LAB — COMPREHENSIVE METABOLIC PANEL
ALK PHOS: 74 U/L (ref 39–117)
ALT: 37 U/L (ref 0–53)
AST: 32 U/L (ref 0–37)
Albumin: 4.5 g/dL (ref 3.5–5.2)
BILIRUBIN TOTAL: 0.6 mg/dL (ref 0.2–1.2)
BUN: 19 mg/dL (ref 6–23)
CO2: 25 mEq/L (ref 19–32)
CREATININE: 0.8 mg/dL (ref 0.4–1.5)
Calcium: 8.9 mg/dL (ref 8.4–10.5)
Chloride: 106 mEq/L (ref 96–112)
GFR: 108.04 mL/min (ref >60.00)
Glucose, Bld: 114 mg/dL — ABNORMAL HIGH (ref 70–99)
Potassium: 4 mEq/L (ref 3.5–5.1)
SODIUM: 140 meq/L (ref 135–145)
TOTAL PROTEIN: 7.4 g/dL (ref 6.0–8.3)

## 2014-06-09 LAB — URINALYSIS, ROUTINE W REFLEX MICROSCOPIC
BILIRUBIN URINE: NEGATIVE
HGB URINE DIPSTICK: NEGATIVE
Ketones, ur: NEGATIVE
LEUKOCYTES UA: NEGATIVE
NITRITE: NEGATIVE
PH: 6 (ref 5.0–8.0)
Specific Gravity, Urine: 1.025 (ref 1.000–1.030)
Total Protein, Urine: NEGATIVE
UROBILINOGEN UA: 0.2 (ref 0.0–1.0)
Urine Glucose: NEGATIVE

## 2014-06-09 LAB — CBC WITH DIFFERENTIAL/PLATELET
BASOS ABS: 0.1 10*3/uL (ref 0.0–0.1)
Basophils Relative: 0.8 % (ref 0.0–3.0)
EOS ABS: 0.7 10*3/uL (ref 0.0–0.7)
EOS PCT: 6.8 % — AB (ref 0.0–5.0)
HEMATOCRIT: 47.6 % (ref 39.0–52.0)
HEMOGLOBIN: 15.6 g/dL (ref 13.0–17.0)
LYMPHS ABS: 3.6 10*3/uL (ref 0.7–4.0)
Lymphocytes Relative: 36.2 % (ref 12.0–46.0)
MCHC: 32.8 g/dL (ref 30.0–36.0)
MCV: 88.1 fl (ref 78.0–100.0)
MONO ABS: 0.8 10*3/uL (ref 0.1–1.0)
MONOS PCT: 7.8 % (ref 3.0–12.0)
NEUTROS ABS: 4.8 10*3/uL (ref 1.4–7.7)
Neutrophils Relative %: 48.4 % (ref 43.0–77.0)
PLATELETS: 241 10*3/uL (ref 150.0–400.0)
RBC: 5.41 Mil/uL (ref 4.22–5.81)
RDW: 13 % (ref 11.5–15.5)
WBC: 10 10*3/uL (ref 4.0–10.5)

## 2014-06-09 LAB — HEMOGLOBIN A1C: HEMOGLOBIN A1C: 5.7 % (ref 4.6–6.5)

## 2014-06-09 LAB — LIPID PANEL
Cholesterol: 204 mg/dL — ABNORMAL HIGH (ref 0–200)
HDL: 44.8 mg/dL (ref >39.00)
LDL CALC: 142 mg/dL — AB (ref 0–99)
NONHDL: 159.2
Total CHOL/HDL Ratio: 5
Triglycerides: 88 mg/dL (ref 0.0–149.0)
VLDL: 17.6 mg/dL (ref 0.0–40.0)

## 2014-06-09 LAB — TSH: TSH: 0.98 u[IU]/mL (ref 0.35–4.50)

## 2014-06-09 NOTE — Telephone Encounter (Signed)
emmi mailed  °

## 2014-06-10 LAB — HIV ANTIBODY (ROUTINE TESTING W REFLEX): HIV 1&2 Ab, 4th Generation: NONREACTIVE

## 2014-06-13 ENCOUNTER — Other Ambulatory Visit: Payer: Self-pay | Admitting: Internal Medicine

## 2014-06-14 ENCOUNTER — Encounter: Payer: Self-pay | Admitting: Internal Medicine

## 2014-09-11 ENCOUNTER — Other Ambulatory Visit: Payer: Self-pay | Admitting: Internal Medicine

## 2014-11-08 ENCOUNTER — Other Ambulatory Visit: Payer: Self-pay | Admitting: Internal Medicine

## 2014-11-08 DIAGNOSIS — B351 Tinea unguium: Secondary | ICD-10-CM | POA: Insufficient documentation

## 2014-11-08 MED ORDER — TERBINAFINE HCL 250 MG PO TABS: 250.0000 mg | ORAL_TABLET | Freq: Every day | ORAL | Status: DC

## 2015-04-21 ENCOUNTER — Encounter: Payer: Self-pay | Admitting: Internal Medicine

## 2015-04-21 ENCOUNTER — Ambulatory Visit (INDEPENDENT_AMBULATORY_CARE_PROVIDER_SITE_OTHER): Payer: 59 | Admitting: Internal Medicine

## 2015-04-21 VITALS — BP 130/80 | HR 92 | Temp 98.2°F | Resp 14 | Ht 67.0 in | Wt 211.0 lb

## 2015-04-21 DIAGNOSIS — B079 Viral wart, unspecified: Secondary | ICD-10-CM | POA: Diagnosis not present

## 2015-04-21 NOTE — Progress Notes (Signed)
Pre visit review using our clinic review tool, if applicable. No additional management support is needed unless otherwise documented below in the visit note. 

## 2015-04-21 NOTE — Progress Notes (Signed)
   Subjective:  Patient ID: Ethan Crosby, male    DOB: June 03, 1966  Age: 49 y.o. MRN: [id number]  CC: Rash   HPI Ethan Crosby presents for a 6 week hx of lesions on his left scalp, there are 3, they do not bother him.  Outpatient Prescriptions Prior to Visit  Medication Sig Dispense Refill  . terbinafine (LAMISIL) 250 MG tablet Take 1 tablet (250 mg total) by mouth daily. 30 tablet 0  . venlafaxine XR (EFFEXOR-XR) 75 MG 24 hr capsule TAKE (1) CAPSULE DAILY. 30 capsule 11   No facility-administered medications prior to visit.    ROS Review of Systems  All other systems reviewed and are negative.   Objective:  BP 130/80 mmHg  Pulse 92  Temp(Src) 98.2 F (36.8 C) (Oral)  Resp 14  Ht 5\' 7"  (1.702 m)  Wt 211 lb (95.709 kg)  BMI 33.04 kg/m2  SpO2 98%  BP Readings from Last 3 Encounters:  04/21/15 130/80  06/06/14 122/80  07/18/12 128/76    Wt Readings from Last 3 Encounters:  04/21/15 211 lb (95.709 kg)  06/06/14 207 lb (93.895 kg)  07/18/12 198 lb (89.812 kg)    Physical Exam  HENT:  Head:      Lab Results  Component Value Date   WBC 10.0 06/09/2014   HGB 15.6 06/09/2014   HCT 47.6 06/09/2014   PLT 241.0 06/09/2014   GLUCOSE 114* 06/09/2014   CHOL 204* 06/09/2014   TRIG 88.0 06/09/2014   HDL 44.80 06/09/2014   LDLDIRECT 146.8 03/31/2008   LDLCALC 142* 06/09/2014   ALT 37 06/09/2014   AST 32 06/09/2014   NA 140 06/09/2014   K 4.0 06/09/2014   CL 106 06/09/2014   CREATININE 0.8 06/09/2014   BUN 19 06/09/2014   CO2 25 06/09/2014   TSH 0.98 06/09/2014   HGBA1C 5.7 06/09/2014    No results found.  Assessment & Plan:   [name] was seen today for rash.  Diagnoses and all orders for this visit:  Verrucous skin lesion- the lesions appear to be viral warts or verrucoid actinic keratoses, there are no features of atypia, treated with cryotherapy, will recheck the lesions in 3 weeks and may treat again if not totally resolved  I am having Mr.  Judeth Porchunstall maintain his venlafaxine XR and terbinafine.  No orders of the defined types were placed in this encounter.     Follow-up: No Follow-up on file.  Sanda Lingerhomas Amylynn Fano, MD

## 2015-05-18 ENCOUNTER — Other Ambulatory Visit: Payer: Self-pay | Admitting: Internal Medicine

## 2015-05-18 MED ORDER — MOXIFLOXACIN HCL 400 MG PO TABS: 400.0000 mg | ORAL_TABLET | Freq: Every day | ORAL | Status: AC

## 2015-08-17 ENCOUNTER — Other Ambulatory Visit: Payer: Self-pay | Admitting: Internal Medicine

## 2015-09-15 ENCOUNTER — Other Ambulatory Visit: Payer: Self-pay | Admitting: Internal Medicine

## 2015-09-23 ENCOUNTER — Encounter: Payer: Self-pay | Admitting: Internal Medicine

## 2015-09-23 ENCOUNTER — Ambulatory Visit (INDEPENDENT_AMBULATORY_CARE_PROVIDER_SITE_OTHER): Payer: BLUE CROSS/BLUE SHIELD | Admitting: Internal Medicine

## 2015-09-23 VITALS — BP 128/82 | HR 86 | Temp 98.2°F | Resp 16 | Ht 67.0 in | Wt 212.0 lb

## 2015-09-23 DIAGNOSIS — N5089 Other specified disorders of the male genital organs: Secondary | ICD-10-CM | POA: Insufficient documentation

## 2015-09-23 DIAGNOSIS — N509 Disorder of male genital organs, unspecified: Secondary | ICD-10-CM

## 2015-09-23 DIAGNOSIS — F329 Major depressive disorder, single episode, unspecified: Secondary | ICD-10-CM

## 2015-09-23 DIAGNOSIS — N451 Epididymitis: Secondary | ICD-10-CM | POA: Diagnosis not present

## 2015-09-23 DIAGNOSIS — F32A Depression, unspecified: Secondary | ICD-10-CM

## 2015-09-23 MED ORDER — VENLAFAXINE HCL ER 75 MG PO CP24: 75.0000 mg | ORAL_CAPSULE | Freq: Every day | ORAL | Status: DC

## 2015-09-23 MED ORDER — LEVOFLOXACIN 500 MG PO TABS: 500.0000 mg | ORAL_TABLET | Freq: Every day | ORAL | Status: AC

## 2015-09-23 NOTE — Patient Instructions (Signed)
Epididymitis °Epididymitis is swelling (inflammation) of the epididymis. The epididymis is a cord-like structure that is located along the top and back part of the testicle. It collects and stores sperm from the testicle. °This condition can also cause pain and swelling of the testicle and scrotum. Symptoms usually start suddenly (acute epididymitis). Sometimes epididymitis starts gradually and lasts for a while (chronic epididymitis). This type may be harder to treat. °CAUSES °In men 35 and younger, this condition is usually caused by a bacterial infection or sexually transmitted disease (STD), such as: °· Gonorrhea. °· Chlamydia.   °In men 35 and older who do not have anal sex, this condition is usually caused by bacteria from a blockage or abnormalities in the urinary system. These can result from: °· Having a tube placed into the bladder (urinary catheter). °· Having an enlarged or inflamed prostate gland. °· Having recent urinary tract surgery. °In men who have a condition that weakens the body's defense system (immune system), such as HIV, this condition can be caused by:  °· Other bacteria, including tuberculosis and syphilis. °· Viruses. °· Fungi. °Sometimes this condition occurs without infection. That may happen if urine flows backward into the epididymis after heavy lifting or straining. °RISK FACTORS °This condition is more likely to develop in men: °· Who have unprotected sex with more than one partner. °· Who have anal sex.   °· Who have recently had surgery.   °· Who have a urinary catheter. °· Who have urinary problems. °· Who have a suppressed immune system.   °SYMPTOMS  °This condition usually begins suddenly with chills, fever, and pain behind the scrotum and in the testicle. Other symptoms include:  °· Swelling of the scrotum, testicle, or both. °· Pain when ejaculating or urinating. °· Pain in the back or belly. °· Nausea. °· Itching and discharge from the penis. °· Frequent need to pass  urine. °· Redness and tenderness of the scrotum. °DIAGNOSIS °Your health care provider can diagnose this condition based on your symptoms and medical history. Your health care provider will also do a physical exam to ask about your symptoms and check your scrotum and testicle for swelling, pain, and redness. You may also have other tests, including:   °· Examination of discharge from the penis. °· Urine tests for infections, such as STDs.   °Your health care provider may test you for other STDs, including HIV.  °TREATMENT °Treatment for this condition depends on the cause. If your condition is caused by a bacterial infection, oral antibiotic medicine may be prescribed. If the bacterial infection has spread to your blood, you may need to receive IV antibiotics. Nonbacterial epididymitis is treated with home care that includes bed rest and elevation of the scrotum. °Surgery may be needed to treat: °· Bacterial epididymitis that causes pus to build up in the scrotum (abscess). °· Chronic epididymitis that has not responded to other treatments. °HOME CARE INSTRUCTIONS °Medicines  °· Take over-the-counter and prescription medicines only as told by your health care provider.   °· If you were prescribed an antibiotic medicine, take it as told by your health care provider. Do not stop taking the antibiotic even if your condition improves. °Sexual Activity  °· If your epididymitis was caused by an STD, avoid sexual activity until your treatment is complete. °· Inform your sexual partner or partners if you test positive for an STD. They may need to be treated. Do not engage in sexual activity with your partner or partners until their treatment is completed. °General Instructions  °· Return to your normal activities as told   by your health care provider. Ask your health care provider what activities are safe for you. °· Keep your scrotum elevated and supported while resting. Ask your health care provider if you should wear a  scrotal support, such as a jockstrap. Wear it as told by your health care provider. °· If directed, apply ice to the affected area:   °¨ Put ice in a plastic bag. °¨ Place a towel between your skin and the bag. °¨ Leave the ice on for 20 minutes, 2-3 times per day. °· Try taking a sitz bath to help with discomfort. This is a warm water bath that is taken while you are sitting down. The water should only come up to your hips and should cover your buttocks. Do this 3-4 times per day or as told by your health care provider. °· Keep all follow-up visits as told by your health care provider. This is important. °SEEK MEDICAL CARE IF:  °· You have a fever.   °· Your pain medicine is not helping.   °· Your pain is getting worse.   °· Your symptoms do not improve within three days. °  °This information is not intended to replace advice given to you by your health care provider. Make sure you discuss any questions you have with your health care provider. °  °Document Released: 06/17/2000 Document Revised: 03/11/2015 Document Reviewed: 11/05/2014 °[employer] Interactive Patient Education ©2016 [employer] ° °

## 2015-09-23 NOTE — Progress Notes (Signed)
Pre visit review using our clinic review tool, if applicable. No additional management support is needed unless otherwise documented below in the visit note. 

## 2015-09-26 NOTE — Progress Notes (Signed)
Subjective:  Patient ID: Ethan Crosby, male    DOB: 1965-12-11  Age: 50 y.o. MRN: [id number]  CC: Testicle Pain   HPI WINFREY CHILLEMI presents for a 2 week history of painful nodule in left scrotum. He denies rash, lymphadenopathy, swollen scrotum, dysuria, hematuria, penile discharge.  Outpatient Prescriptions Prior to Visit  Medication Sig Dispense Refill  . venlafaxine XR (EFFEXOR-XR) 75 MG 24 hr capsule Take 1 capsule (75 mg total) by mouth daily with breakfast. Overdue for yearly physical w/labs must see md for refills 30 capsule 0   No facility-administered medications prior to visit.    ROS Review of Systems  Constitutional: Negative.  Negative for fever and chills.  HENT: Negative.  Negative for sore throat.   Eyes: Negative.   Respiratory: Negative.   Cardiovascular: Negative.   Gastrointestinal: Negative.  Negative for nausea, vomiting, abdominal pain and diarrhea.  Endocrine: Negative.   Genitourinary: Positive for testicular pain. Negative for urgency, frequency, hematuria, flank pain, decreased urine volume, discharge, penile swelling, scrotal swelling, difficulty urinating, genital sores and penile pain.  Musculoskeletal: Negative.   Skin: Negative.  Negative for rash.  Neurological: Negative.   Hematological: Negative.  Negative for adenopathy. Does not bruise/bleed easily.  Psychiatric/Behavioral: Negative.     Objective:  BP 128/82 mmHg  Pulse 86  Temp(Src) 98.2 F (36.8 C) (Oral)  Resp 16  Ht  (1.702 m)  Wt 212 lb (96.163 kg)  BMI 33.20 kg/m2  SpO2 98%  BP Readings from Last 3 Encounters:  09/23/15 128/82  04/21/15 130/80  06/06/14 122/80    Wt Readings from Last 3 Encounters:  09/23/15 212 lb (96.163 kg)  04/21/15 211 lb (95.709 kg)  06/06/14 207 lb (93.895 kg)    Physical Exam  Constitutional: He is oriented to person, place, and time.  Non-toxic appearance. He does not have a sickly appearance. He does not appear ill. No  distress.  HENT:  Mouth/Throat: Oropharynx is clear and moist. No oropharyngeal exudate.  Eyes: Conjunctivae are normal. Right eye exhibits no discharge. Left eye exhibits no discharge. No scleral icterus.  Neck: Normal range of motion. Neck supple. No JVD present. No tracheal deviation present. No thyromegaly present.  Cardiovascular: Normal rate, regular rhythm, normal heart sounds and intact distal pulses.  Exam reveals no gallop and no friction rub.   No murmur heard. Pulmonary/Chest: Effort normal and breath sounds normal. No stridor. No respiratory distress. He has no wheezes. He has no rales. He exhibits no tenderness.  Abdominal: Soft. Bowel sounds are normal. He exhibits no distension and no mass. There is no tenderness. There is no rebound and no guarding. Hernia confirmed negative in the right inguinal area and confirmed negative in the left inguinal area.  Genitourinary: Penis normal. Right testis shows no mass, no swelling and no tenderness. Right testis is descended. Left testis shows mass and tenderness. Left testis shows no swelling. Left testis is descended. Circumcised. No penile erythema or penile tenderness. No discharge found.  Above and behind the left testicle around the epididymis there is a 1 cm very discrete, hard, slightly painful nodule. This feels most consistent with a spermatocele. The epididymis is also tender but does not feel enlarged.  Musculoskeletal: Normal range of motion. He exhibits no edema or tenderness.  Lymphadenopathy:    He has no cervical adenopathy.       Right: No inguinal adenopathy present.       Left: No inguinal adenopathy present.  Neurological: He  is oriented to person, place, and time.  Skin: Skin is warm and dry. No rash noted. He is not diaphoretic. No erythema. No pallor.  Vitals reviewed.   Lab Results  Component Value Date   WBC 10.0 06/09/2014   HGB 15.6 06/09/2014   HCT 47.6 06/09/2014   PLT 241.0 06/09/2014   GLUCOSE 114*  06/09/2014   CHOL 204* 06/09/2014   TRIG 88.0 06/09/2014   HDL 44.80 06/09/2014   LDLDIRECT 146.8 03/31/2008   LDLCALC 142* 06/09/2014   ALT 37 06/09/2014   AST 32 06/09/2014   NA 140 06/09/2014   K 4.0 06/09/2014   CL 106 06/09/2014   CREATININE 0.8 06/09/2014   BUN 19 06/09/2014   CO2 25 06/09/2014   TSH 0.98 06/09/2014   HGBA1C 5.7 06/09/2014    No results found.  Assessment & Plan:   [name] was seen today for testicle pain.  Diagnoses and all orders for this visit:  Depression, controlled -     venlafaxine XR (EFFEXOR-XR) 75 MG 24 hr capsule; Take 1 capsule (75 mg total) by mouth daily with breakfast.  Epididymitis, left- will treat for infection with Levaquin, he will wear tight supportive underwear and will take ibuprofen as needed for discomfort. -     US Scrotum; Future -     levofloxacin (LEVAQUIN) 500 MG tablet; Take 1 tablet (500 mg total) by mouth daily.  Scrotal mass- I will screen for cancer with an ultrasound, will also see if the ultrasound confirms the diagnosis of spermatocele -     US Scrotum; Future   I have changed Mr. Glendell Dockerunstall's venlafaxine XR. I am also having him start on levofloxacin.  Meds ordered this encounter  Medications  . venlafaxine XR (EFFEXOR-XR) 75 MG 24 hr capsule    Sig: Take 1 capsule (75 mg total) by mouth daily with breakfast.    Dispense:  30 capsule    Refill:  11  . levofloxacin (LEVAQUIN) 500 MG tablet    Sig: Take 1 tablet (500 mg total) by mouth daily.    Dispense:  14 tablet    Refill:  0     Follow-up: Return in about 4 weeks (around 10/21/2015).  Sanda Lingerhomas Zaion Hreha, MD

## 2015-10-02 ENCOUNTER — Other Ambulatory Visit: Payer: Self-pay | Admitting: Internal Medicine

## 2015-10-02 MED ORDER — METHYLPREDNISOLONE 4 MG PO TBPK: ORAL_TABLET | ORAL | Status: DC

## 2015-10-06 ENCOUNTER — Ambulatory Visit
Admission: RE | Admit: 2015-10-06 | Discharge: 2015-10-06 | Disposition: A | Discharge status: Home / Self Care | Payer: BLUE CROSS/BLUE SHIELD | Source: Ambulatory Visit | Attending: Internal Medicine | Admitting: Internal Medicine

## 2015-10-06 DIAGNOSIS — N5089 Other specified disorders of the male genital organs: Secondary | ICD-10-CM

## 2015-10-06 DIAGNOSIS — N503 Cyst of epididymis: Secondary | ICD-10-CM | POA: Diagnosis not present

## 2015-10-06 DIAGNOSIS — N451 Epididymitis: Secondary | ICD-10-CM

## 2015-10-08 ENCOUNTER — Other Ambulatory Visit: Payer: Self-pay | Admitting: Internal Medicine

## 2015-10-08 MED ORDER — METHYLPREDNISOLONE 4 MG PO TBPK: ORAL_TABLET | ORAL | Status: DC

## 2015-12-02 ENCOUNTER — Other Ambulatory Visit: Payer: Self-pay | Admitting: Internal Medicine

## 2015-12-02 DIAGNOSIS — B351 Tinea unguium: Secondary | ICD-10-CM

## 2015-12-02 MED ORDER — TERBINAFINE HCL 250 MG PO TABS
250.0000 mg | ORAL_TABLET | Freq: Every day | ORAL | Status: DC
Start: 2015-12-02 — End: 2015-12-30

## 2015-12-23 ENCOUNTER — Other Ambulatory Visit: Payer: Self-pay | Admitting: Internal Medicine

## 2015-12-23 DIAGNOSIS — B351 Tinea unguium: Secondary | ICD-10-CM

## 2015-12-30 ENCOUNTER — Encounter: Payer: Self-pay | Admitting: Internal Medicine

## 2015-12-30 ENCOUNTER — Other Ambulatory Visit (INDEPENDENT_AMBULATORY_CARE_PROVIDER_SITE_OTHER): Payer: BLUE CROSS/BLUE SHIELD

## 2015-12-30 ENCOUNTER — Ambulatory Visit (INDEPENDENT_AMBULATORY_CARE_PROVIDER_SITE_OTHER): Payer: BLUE CROSS/BLUE SHIELD | Admitting: Internal Medicine

## 2015-12-30 VITALS — BP 124/80 | HR 92 | Temp 98.2°F | Ht 67.0 in | Wt 202.0 lb

## 2015-12-30 DIAGNOSIS — B351 Tinea unguium: Secondary | ICD-10-CM

## 2015-12-30 DIAGNOSIS — Z0001 Encounter for general adult medical examination with abnormal findings: Secondary | ICD-10-CM | POA: Diagnosis not present

## 2015-12-30 DIAGNOSIS — R7989 Other specified abnormal findings of blood chemistry: Secondary | ICD-10-CM

## 2015-12-30 DIAGNOSIS — R739 Hyperglycemia, unspecified: Secondary | ICD-10-CM

## 2015-12-30 DIAGNOSIS — Z0000 Encounter for general adult medical examination without abnormal findings: Secondary | ICD-10-CM

## 2015-12-30 DIAGNOSIS — E785 Hyperlipidemia, unspecified: Secondary | ICD-10-CM

## 2015-12-30 LAB — COMPREHENSIVE METABOLIC PANEL
ALT: 27 U/L (ref 0–53)
AST: 21 U/L (ref 0–37)
Albumin: 4.7 g/dL (ref 3.5–5.2)
Alkaline Phosphatase: 66 U/L (ref 39–117)
BUN: 21 mg/dL (ref 6–23)
CHLORIDE: 104 meq/L (ref 96–112)
CO2: 26 meq/L (ref 19–32)
Calcium: 9.8 mg/dL (ref 8.4–10.5)
Creatinine, Ser: 0.79 mg/dL (ref 0.40–1.50)
GFR: 110.49 mL/min (ref >60.00)
GLUCOSE: 97 mg/dL (ref 70–99)
POTASSIUM: 4.6 meq/L (ref 3.5–5.1)
SODIUM: 136 meq/L (ref 135–145)
TOTAL PROTEIN: 7.1 g/dL (ref 6.0–8.3)
Total Bilirubin: 0.3 mg/dL (ref 0.2–1.2)

## 2015-12-30 LAB — CBC WITH DIFFERENTIAL/PLATELET
BASOS ABS: 0.1 10*3/uL (ref 0.0–0.1)
Basophils Relative: 0.7 % (ref 0.0–3.0)
EOS ABS: 0.9 10*3/uL — AB (ref 0.0–0.7)
Eosinophils Relative: 9.1 % — ABNORMAL HIGH (ref 0.0–5.0)
HEMATOCRIT: 47.9 % (ref 39.0–52.0)
HEMOGLOBIN: 16.1 g/dL (ref 13.0–17.0)
LYMPHS PCT: 29.5 % (ref 12.0–46.0)
Lymphs Abs: 3.1 10*3/uL (ref 0.7–4.0)
MCHC: 33.5 g/dL (ref 30.0–36.0)
MCV: 85.5 fl (ref 78.0–100.0)
MONO ABS: 0.8 10*3/uL (ref 0.1–1.0)
Monocytes Relative: 7.3 % (ref 3.0–12.0)
Neutro Abs: 5.6 10*3/uL (ref 1.4–7.7)
Neutrophils Relative %: 53.4 % (ref 43.0–77.0)
PLATELETS: 244 10*3/uL (ref 150.0–400.0)
RBC: 5.6 Mil/uL (ref 4.22–5.81)
RDW: 13.3 % (ref 11.5–15.5)
WBC: 10.4 10*3/uL (ref 4.0–10.5)

## 2015-12-30 LAB — LIPID PANEL
CHOL/HDL RATIO: 9
CHOL/HDL RATIO: 8
CHOLESTEROL: 301 mg/dL — AB (ref 0–200)
Cholesterol: 331 mg/dL — ABNORMAL HIGH (ref 0–200)
HDL: 37.7 mg/dL — ABNORMAL LOW (ref >39.00)
HDL: 37.7 mg/dL — AB (ref >39.00)
NONHDL: 293.71
NONHDL: 263.48
TRIGLYCERIDES: 204 mg/dL — AB (ref 0.0–149.0)
Triglycerides: 244 mg/dL — ABNORMAL HIGH (ref 0.0–149.0)
VLDL: 48.8 mg/dL — AB (ref 0.0–40.0)
VLDL: 40.8 mg/dL — AB (ref 0.0–40.0)

## 2015-12-30 LAB — FECAL OCCULT BLOOD, GUAIAC: FECAL OCCULT BLD: NEGATIVE

## 2015-12-30 LAB — LDL CHOLESTEROL, DIRECT
Direct LDL: 245 mg/dL
Direct LDL: 236 mg/dL

## 2015-12-30 LAB — HEMOGLOBIN A1C: HEMOGLOBIN A1C: 5.3 % (ref 4.6–6.5)

## 2015-12-30 LAB — PSA: PSA: 1.03 ng/mL (ref 0.10–4.00)

## 2015-12-30 LAB — TSH: TSH: 1.22 u[IU]/mL (ref 0.35–4.50)

## 2015-12-30 MED ORDER — TERBINAFINE HCL 250 MG PO TABS: 250.0000 mg | ORAL_TABLET | Freq: Every day | ORAL | Status: DC

## 2015-12-30 MED ORDER — ROSUVASTATIN CALCIUM 20 MG PO TABS: 20.0000 mg | ORAL_TABLET | Freq: Every day | ORAL | Status: DC

## 2015-12-30 NOTE — Progress Notes (Signed)
Pre visit review using our clinic review tool, if applicable. No additional management support is needed unless otherwise documented below in the visit note. 

## 2015-12-30 NOTE — Progress Notes (Signed)
Subjective:  Patient ID: Ethan Crosby, male    DOB: 21-Jul-1965  Age: 50 y.o. MRN: [id number]  CC: Annual Exam   HPI Ethan Crosby presents for a CPX. He was seen a few months ago for a cyst in his left testicle he tells me that that it feels much better, he has had no episodes of pain or swelling. He has started a ketogenic diet and wants to check his labs today to see if there have been any effects from that. He has been able to lose about 10 or 15 pounds on this new diet. Over the last month he has been taking terbinafine for painful great toenail onychomycosis on the left side. He has tolerated the terbinafine well with no abdominal pain, rash, nausea, or vomiting. He feels well today and offers no complaints.  Outpatient Prescriptions Prior to Visit  Medication Sig Dispense Refill  . venlafaxine XR (EFFEXOR-XR) 75 MG 24 hr capsule Take 1 capsule (75 mg total) by mouth daily with breakfast. 30 capsule 11  . terbinafine (LAMISIL) 250 MG tablet Take 1 tablet (250 mg total) by mouth daily. 30 tablet 0  . methylPREDNISolone (MEDROL DOSEPAK) 4 MG TBPK tablet TAKE AS DIRECTED 21 tablet 0   No facility-administered medications prior to visit.    ROS Review of Systems  Constitutional: Negative for activity change, appetite change, fatigue and unexpected weight change.  HENT: Negative.   Eyes: Negative.   Respiratory: Negative.  Negative for cough, choking, chest tightness, shortness of breath and stridor.   Cardiovascular: Negative.  Negative for chest pain, palpitations and leg swelling.  Gastrointestinal: Negative.  Negative for nausea, vomiting, abdominal pain, diarrhea and constipation.  Endocrine: Negative.   Genitourinary: Negative.  Negative for urgency, decreased urine volume, discharge, penile swelling, scrotal swelling, difficulty urinating, genital sores, penile pain and testicular pain.  Musculoskeletal: Negative.  Negative for myalgias, back pain, joint swelling and  arthralgias.  Skin: Negative.  Negative for color change and rash.  Allergic/Immunologic: Negative.   Neurological: Negative.  Negative for dizziness, weakness and numbness.  Hematological: Negative.  Negative for adenopathy. Does not bruise/bleed easily.  Psychiatric/Behavioral: Negative.  Negative for suicidal ideas, behavioral problems, confusion, sleep disturbance, self-injury and dysphoric mood. The patient is not nervous/anxious.     Objective:  BP 124/80 mmHg  Pulse 92  Temp(Src) 98.2 F (36.8 C) (Oral)  Ht 5\' 7"  (1.702 m)  Wt 202 lb (91.627 kg)  BMI 31.63 kg/m2  SpO2 96%  BP Readings from Last 3 Encounters:  12/30/15 124/80  09/23/15 128/82  04/21/15 130/80    Wt Readings from Last 3 Encounters:  12/30/15 202 lb (91.627 kg)  09/23/15 212 lb (96.163 kg)  04/21/15 211 lb (95.709 kg)    Physical Exam  Constitutional: He is oriented to person, place, and time. No distress.  HENT:  Head: Normocephalic and atraumatic.  Mouth/Throat: Oropharynx is clear and moist. No oropharyngeal exudate.  Eyes: Conjunctivae are normal. Right eye exhibits no discharge. Left eye exhibits no discharge. No scleral icterus.  Neck: Normal range of motion. Neck supple. No JVD present. No tracheal deviation present. No thyromegaly present.  Cardiovascular: Normal rate, regular rhythm, normal heart sounds and intact distal pulses.  Exam reveals no gallop and no friction rub.   No murmur heard. Pulmonary/Chest: Effort normal and breath sounds normal. No stridor. No respiratory distress. He has no wheezes. He has no rales. He exhibits no tenderness.  Abdominal: Soft. Bowel sounds are normal. He exhibits  no distension and no mass. There is no tenderness. There is no rebound and no guarding. Hernia confirmed negative in the right inguinal area and confirmed negative in the left inguinal area.  Genitourinary: Rectum normal and penis normal. Rectal exam shows no external hemorrhoid, no internal  hemorrhoid, no fissure, no mass, no tenderness and anal tone normal. Guaiac negative stool. Prostate is enlarged (1+ smooth symm BPH). Prostate is not tender. Right testis shows no mass, no swelling and no tenderness. Right testis is descended. Left testis shows mass (benign cyst). Left testis shows no swelling and no tenderness. Left testis is descended. No penile erythema or penile tenderness. No discharge found.  Musculoskeletal: Normal range of motion. He exhibits no edema or tenderness.  Lymphadenopathy:    He has no cervical adenopathy.       Right: No inguinal adenopathy present.       Left: No inguinal adenopathy present.  Neurological: He is oriented to person, place, and time.  Skin: Skin is warm and dry. No rash noted. He is not diaphoretic. No erythema. No pallor.  Psychiatric: He has a normal mood and affect. His behavior is normal. Judgment and thought content normal.  Vitals reviewed.   Lab Results  Component Value Date   WBC 10.4 12/30/2015   HGB 16.1 12/30/2015   HCT 47.9 12/30/2015   PLT 244.0 12/30/2015   GLUCOSE 97 12/30/2015   CHOL 301* 12/30/2015   TRIG 204.0* 12/30/2015   HDL 37.70* 12/30/2015   LDLDIRECT 236.0 12/30/2015   LDLCALC 142* 06/09/2014   ALT 27 12/30/2015   AST 21 12/30/2015   NA 136 12/30/2015   K 4.6 12/30/2015   CL 104 12/30/2015   CREATININE 0.79 12/30/2015   BUN 21 12/30/2015   CO2 26 12/30/2015   TSH 1.22 12/30/2015   PSA 1.03 12/30/2015   HGBA1C 5.3 12/30/2015    [place] Scrotum  10/06/2015  CLINICAL DATA:  LEFT epididymal mass, LEFT epididymitis EXAM: ULTRASOUND OF SCROTUM TECHNIQUE: Complete ultrasound examination of the testicles, epididymis, and other scrotal structures was performed. COMPARISON:  None. FINDINGS: Right testicle Measurements: 5.1 x 2.4 x 3.3 cm. Normal echogenicity without mass or calcification. Internal blood flow present on color Doppler imaging. Left testicle Measurements: 5.0 2.5 x 3.0 cm. Normal echogenicity without  mass or calcification. Internal blood flow present on color Doppler imaging. Right epididymis:  Normal in size and appearance. Left epididymis: 2 cysts at epididymal head, larger 3.4 x 1.8 x 2.0 cm and smaller 1.0 x 1.0 x 1.1 cm, question epididymal cysts versus spermatoceles. Both cysts are simple in character. Hydrocele:  Absent bilaterally Varicocele:  Not assessed; Doppler exam not requested/not performed. IMPRESSION: Two cysts at the LEFT epididymal head measuring 3.4 x 1.8 x 2.0 cm and 1.0 x 1.0 x 1.1 cm, question epididymal cysts versus spermatoceles. Electronically Signed   By: Ulyses SouthwardMark  Boles M.D.   On: 10/06/2015 10:59    Assessment & Plan:   Ethan FearingJames was seen today for annual exam.  Diagnoses and all orders for this visit:  Routine general medical examination at a health care facility- Exam completed, labs ordered and reviewed, vaccines reviewed, patient education information was given -     Lipid panel; Future -     CBC with Differential/Platelet; Future -     TSH; Future -     PSA; Future -     Lipid panel; Future  Onychomycosis of toenail- he is tolerating terbinafine well, we'll continue for another 2 months.  Hyperglycemia- improvement noted -     Hemoglobin A1c; Future  Hyperlipidemia with target LDL less than 100- his LDL has increased dramatically over the last year, this could be caused by the ketogenic diet or aging, for the time being I have asked him to start taking a statin for risk reduction. -     rosuvastatin (CRESTOR) 20 MG tablet; Take 1 tablet (20 mg total) by mouth daily.  Onychomycosis of left great toe -     terbinafine (LAMISIL) 250 MG tablet; Take 1 tablet (250 mg total) by mouth daily.   I have discontinued Ethan Crosby methylPREDNISolone. I am also having him start on rosuvastatin. Additionally, I am having him maintain his venlafaxine XR and terbinafine.  Meds ordered this encounter  Medications  . rosuvastatin (CRESTOR) 20 MG tablet    Sig: Take 1  tablet (20 mg total) by mouth daily.    Dispense:  90 tablet    Refill:  3  . terbinafine (LAMISIL) 250 MG tablet    Sig: Take 1 tablet (250 mg total) by mouth daily.    Dispense:  30 tablet    Refill:  1     Follow-up: Return if symptoms worsen or fail to improve.  Sanda Linger, MD

## 2015-12-30 NOTE — Patient Instructions (Signed)

## 2015-12-31 ENCOUNTER — Encounter: Payer: Self-pay | Admitting: Internal Medicine

## 2016-01-14 DIAGNOSIS — Z713 Dietary counseling and surveillance: Secondary | ICD-10-CM | POA: Diagnosis not present

## 2016-01-25 ENCOUNTER — Encounter: Payer: Self-pay | Admitting: Internal Medicine

## 2016-01-26 ENCOUNTER — Other Ambulatory Visit: Payer: Self-pay | Admitting: Internal Medicine

## 2016-01-26 ENCOUNTER — Encounter: Payer: Self-pay | Admitting: Internal Medicine

## 2016-01-26 DIAGNOSIS — E785 Hyperlipidemia, unspecified: Secondary | ICD-10-CM

## 2016-02-03 ENCOUNTER — Encounter: Payer: Self-pay | Admitting: Internal Medicine

## 2016-02-03 ENCOUNTER — Other Ambulatory Visit (INDEPENDENT_AMBULATORY_CARE_PROVIDER_SITE_OTHER): Payer: BLUE CROSS/BLUE SHIELD

## 2016-02-03 DIAGNOSIS — E785 Hyperlipidemia, unspecified: Secondary | ICD-10-CM | POA: Diagnosis not present

## 2016-02-03 LAB — LIPID PANEL
CHOL/HDL RATIO: 3
Cholesterol: 134 mg/dL (ref 0–200)
HDL: 39.7 mg/dL (ref >39.00)
LDL Cholesterol: 82 mg/dL (ref 0–99)
NONHDL: 93.91
Triglycerides: 58 mg/dL (ref 0.0–149.0)
VLDL: 11.6 mg/dL (ref 0.0–40.0)

## 2016-02-03 LAB — TSH: TSH: 1.04 u[IU]/mL (ref 0.35–4.50)

## 2016-02-05 DIAGNOSIS — Z713 Dietary counseling and surveillance: Secondary | ICD-10-CM | POA: Diagnosis not present

## 2016-02-08 ENCOUNTER — Encounter: Payer: Self-pay | Admitting: Internal Medicine

## 2016-03-03 ENCOUNTER — Other Ambulatory Visit: Payer: Self-pay | Admitting: Internal Medicine

## 2016-03-03 DIAGNOSIS — N503 Cyst of epididymis: Secondary | ICD-10-CM

## 2016-03-03 MED ORDER — DOXYCYCLINE MONOHYDRATE 100 MG PO CAPS: 100.0000 mg | ORAL_CAPSULE | Freq: Two times a day (BID) | ORAL | 0 refills | Status: AC

## 2016-04-05 ENCOUNTER — Encounter: Payer: Self-pay | Admitting: Internal Medicine

## 2016-04-26 DIAGNOSIS — K4091 Unilateral inguinal hernia, without obstruction or gangrene, recurrent: Secondary | ICD-10-CM | POA: Diagnosis not present

## 2016-04-26 DIAGNOSIS — N4342 Spermatocele of epididymis, multiple: Secondary | ICD-10-CM | POA: Diagnosis not present

## 2016-05-25 ENCOUNTER — Ambulatory Visit (INDEPENDENT_AMBULATORY_CARE_PROVIDER_SITE_OTHER): Payer: BLUE CROSS/BLUE SHIELD | Admitting: Internal Medicine

## 2016-05-25 ENCOUNTER — Encounter: Payer: Self-pay | Admitting: Internal Medicine

## 2016-05-25 ENCOUNTER — Other Ambulatory Visit (INDEPENDENT_AMBULATORY_CARE_PROVIDER_SITE_OTHER): Payer: BLUE CROSS/BLUE SHIELD

## 2016-05-25 VITALS — BP 118/78 | HR 76 | Temp 98.2°F | Resp 16 | Wt 196.0 lb

## 2016-05-25 DIAGNOSIS — E785 Hyperlipidemia, unspecified: Secondary | ICD-10-CM

## 2016-05-25 DIAGNOSIS — R10811 Right upper quadrant abdominal tenderness: Secondary | ICD-10-CM | POA: Diagnosis not present

## 2016-05-25 LAB — URINALYSIS, ROUTINE W REFLEX MICROSCOPIC
Bilirubin Urine: NEGATIVE
Hgb urine dipstick: NEGATIVE
KETONES UR: NEGATIVE
Leukocytes, UA: NEGATIVE
NITRITE: NEGATIVE
PH: 7 (ref 5.0–8.0)
RBC / HPF: NONE SEEN (ref 0–?)
Total Protein, Urine: NEGATIVE
URINE GLUCOSE: NEGATIVE
Urobilinogen, UA: 0.2 (ref 0.0–1.0)
WBC UA: NONE SEEN (ref 0–?)

## 2016-05-25 LAB — COMPREHENSIVE METABOLIC PANEL
ALBUMIN: 4.9 g/dL (ref 3.5–5.2)
ALK PHOS: 71 U/L (ref 39–117)
ALT: 25 U/L (ref 0–53)
AST: 21 U/L (ref 0–37)
BUN: 17 mg/dL (ref 6–23)
CO2: 27 mEq/L (ref 19–32)
CREATININE: 0.92 mg/dL (ref 0.40–1.50)
Calcium: 10.2 mg/dL (ref 8.4–10.5)
Chloride: 102 mEq/L (ref 96–112)
GFR: 92.53 mL/min (ref >60.00)
GLUCOSE: 99 mg/dL (ref 70–99)
POTASSIUM: 4.6 meq/L (ref 3.5–5.1)
SODIUM: 139 meq/L (ref 135–145)
TOTAL PROTEIN: 7.9 g/dL (ref 6.0–8.3)
Total Bilirubin: 0.5 mg/dL (ref 0.2–1.2)

## 2016-05-25 LAB — LIPID PANEL
CHOL/HDL RATIO: 5
Cholesterol: 262 mg/dL — ABNORMAL HIGH (ref 0–200)
HDL: 49 mg/dL (ref >39.00)
LDL CALC: 197 mg/dL — AB (ref 0–99)
NONHDL: 213.26
Triglycerides: 83 mg/dL (ref 0.0–149.0)
VLDL: 16.6 mg/dL (ref 0.0–40.0)

## 2016-05-25 LAB — CBC WITH DIFFERENTIAL/PLATELET
BASOS ABS: 0 10*3/uL (ref 0.0–0.1)
Basophils Relative: 0.3 % (ref 0.0–3.0)
EOS ABS: 0.7 10*3/uL (ref 0.0–0.7)
EOS PCT: 6.6 % — AB (ref 0.0–5.0)
HCT: 49.1 % (ref 39.0–52.0)
HEMOGLOBIN: 17.1 g/dL — AB (ref 13.0–17.0)
LYMPHS ABS: 3.3 10*3/uL (ref 0.7–4.0)
Lymphocytes Relative: 29.6 % (ref 12.0–46.0)
MCHC: 34.8 g/dL (ref 30.0–36.0)
MCV: 85.3 fl (ref 78.0–100.0)
MONO ABS: 0.8 10*3/uL (ref 0.1–1.0)
Monocytes Relative: 6.8 % (ref 3.0–12.0)
NEUTROS PCT: 56.7 % (ref 43.0–77.0)
Neutro Abs: 6.2 10*3/uL (ref 1.4–7.7)
Platelets: 246 10*3/uL (ref 150.0–400.0)
RBC: 5.76 Mil/uL (ref 4.22–5.81)
RDW: 13 % (ref 11.5–15.5)
WBC: 11 10*3/uL — AB (ref 4.0–10.5)

## 2016-05-25 MED ORDER — ROSUVASTATIN CALCIUM 20 MG PO TABS: 20.0000 mg | ORAL_TABLET | Freq: Every day | ORAL | 3 refills | Status: DC

## 2016-05-25 NOTE — Patient Instructions (Signed)

## 2016-05-25 NOTE — Progress Notes (Signed)
Subjective:  Patient ID: Ethan Crosby, male    DOB: 1965/12/15  Age: 50 y.o. MRN: [id number]  CC: Abdominal Pain   HPI Ethan Crosby presents for a 3 week history of right upper quadrant abdominal pain and follow-up on hypercholesterolemia.  He complains of a three-week history of pain in the right upper quadrant of his abdomen. It intermittently feels like a crampy sensation but also there is a subtle consistent dull aching sensation with movement. He denies nausea, vomiting, loss of appetite, fever, chills, rash, diarrhea, constipation, dysuria, or hematuria.  He has a significant elevation in his LDL level but stopped taking a statin 3 months ago. He did not have any side effects to the statin he just thought he didn't need it.  Outpatient Medications Prior to Visit  Medication Sig Dispense Refill  . venlafaxine XR (EFFEXOR-XR) 75 MG 24 hr capsule Take 1 capsule (75 mg total) by mouth daily with breakfast. 30 capsule 11  . rosuvastatin (CRESTOR) 20 MG tablet Take 1 tablet (20 mg total) by mouth daily. (Patient not taking: Reported on 05/25/2016) 90 tablet 3  . terbinafine (LAMISIL) 250 MG tablet Take 1 tablet (250 mg total) by mouth daily. 30 tablet 1   No facility-administered medications prior to visit.     ROS Review of Systems  Constitutional: Negative for activity change, appetite change, chills, diaphoresis, fatigue, fever and unexpected weight change.  HENT: Negative for trouble swallowing and voice change.   Eyes: Negative.   Respiratory: Negative for cough, choking, shortness of breath and wheezing.   Cardiovascular: Negative for chest pain, palpitations and leg swelling.  Gastrointestinal: Positive for abdominal pain. Negative for abdominal distention, blood in stool, constipation, diarrhea, nausea and vomiting.  Endocrine: Negative.   Genitourinary: Negative for decreased urine volume, difficulty urinating, flank pain, frequency, hematuria, penile swelling,  scrotal swelling, testicular pain and urgency.  Musculoskeletal: Negative.  Negative for back pain and neck pain.  Skin: Negative.  Negative for color change and rash.  Allergic/Immunologic: Negative.   Neurological: Negative.   Hematological: Negative for adenopathy. Does not bruise/bleed easily.  Psychiatric/Behavioral: Negative.     Objective:  BP 118/78   Pulse 76   Temp 98.2 F (36.8 C)   Resp 16   Wt 196 lb (88.9 kg)   SpO2 100%   BMI 30.70 kg/m   BP Readings from Last 3 Encounters:  05/25/16 118/78  12/30/15 124/80  09/23/15 128/82    Wt Readings from Last 3 Encounters:  05/25/16 196 lb (88.9 kg)  12/30/15 202 lb (91.6 kg)  09/23/15 212 lb (96.2 kg)    Physical Exam  Constitutional: He is oriented to person, place, and time. He appears well-developed and well-nourished. No distress.  HENT:  Mouth/Throat: Oropharynx is clear and moist. No oropharyngeal exudate.  Eyes: Conjunctivae are normal. Right eye exhibits no discharge. Left eye exhibits no discharge. No scleral icterus.  Neck: Normal range of motion. Neck supple. No JVD present. No tracheal deviation present. No thyromegaly present.  Cardiovascular: Normal rate, regular rhythm, normal heart sounds and intact distal pulses.  Exam reveals no gallop and no friction rub.   No murmur heard. Pulmonary/Chest: Effort normal and breath sounds normal. No stridor. No respiratory distress. He has no wheezes. He has no rales.  Abdominal: Soft. Normal appearance and bowel sounds are normal. He exhibits no distension, no abdominal bruit, no ascites and no mass. There is no hepatosplenomegaly, splenomegaly or hepatomegaly. There is tenderness in the right upper  quadrant. There is no rigidity, no rebound, no guarding, no CVA tenderness, no tenderness at McBurney's point and negative Murphy's sign. No hernia. Hernia confirmed negative in the ventral area, confirmed negative in the right inguinal area and confirmed negative in the  left inguinal area.  Musculoskeletal: Normal range of motion. He exhibits no edema, tenderness or deformity.  Lymphadenopathy:    He has no cervical adenopathy.  Neurological: He is oriented to person, place, and time.  Skin: Skin is warm and dry. No rash noted. He is not diaphoretic. No erythema. No pallor.  Vitals reviewed.   Lab Results  Component Value Date   WBC 11.0 (H) 05/25/2016   HGB 17.1 (H) 05/25/2016   HCT 49.1 05/25/2016   PLT 246.0 05/25/2016   GLUCOSE 99 05/25/2016   CHOL 262 (H) 05/25/2016   TRIG 83.0 05/25/2016   HDL 49.00 05/25/2016   LDLDIRECT 236.0 12/30/2015   LDLCALC 197 (H) 05/25/2016   ALT 25 05/25/2016   AST 21 05/25/2016   NA 139 05/25/2016   K 4.6 05/25/2016   CL 102 05/25/2016   CREATININE 0.92 05/25/2016   BUN 17 05/25/2016   CO2 27 05/25/2016   TSH 1.04 02/03/2016   PSA 1.03 12/30/2015   HGBA1C 5.3 12/30/2015    US Scrotum  Result Date: 10/06/2015 CLINICAL DATA:  LEFT epididymal mass, LEFT epididymitis EXAM: ULTRASOUND OF SCROTUM TECHNIQUE: Complete ultrasound examination of the testicles, epididymis, and other scrotal structures was performed. COMPARISON:  None. FINDINGS: Right testicle Measurements: 5.1 x 2.4 x 3.3 cm. Normal echogenicity without mass or calcification. Internal blood flow present on color Doppler imaging. Left testicle Measurements: 5.0 2.5 x 3.0 cm. Normal echogenicity without mass or calcification. Internal blood flow present on color Doppler imaging. Right epididymis:  Normal in size and appearance. Left epididymis: 2 cysts at epididymal head, larger 3.4 x 1.8 x 2.0 cm and smaller 1.0 x 1.0 x 1.1 cm, question epididymal cysts versus spermatoceles. Both cysts are simple in character. Hydrocele:  Absent bilaterally Varicocele:  Not assessed; Doppler exam not requested/not performed. IMPRESSION: Two cysts at the LEFT epididymal head measuring 3.4 x 1.8 x 2.0 cm and 1.0 x 1.0 x 1.1 cm, question epididymal cysts versus spermatoceles.  Electronically Signed   By: Ulyses Southward M.D.   On: 10/06/2015 10:59    Assessment & Plan:   Ethan Crosby was seen today for abdominal pain.  Diagnoses and all orders for this visit:  Hyperlipidemia with target LDL less than 100- his Framingham risk score is 16%. I've asked him to quit smoking. I've asked him to restart the statin and to start taking an aspirin a day for cardiovascular risk reduction. -     Lipid panel; Future -     rosuvastatin (CRESTOR) 20 MG tablet; Take 1 tablet (20 mg total) by mouth daily. -     aspirin EC 81 MG tablet; Take 1 tablet (81 mg total) by mouth daily.  Right upper quadrant abdominal tenderness without rebound tenderness- he has a slightly elevated white blood cell count so I've ordered a CT scan to see if he has an abscess in his abdomen. He also tells me has a remote history of a renal cyst. He is not sure If its on the right or the left. I have no prior imaging studies. I will look for this on the CT scan to see if this is the cause for his right upper quadrant abdominal pain. The remainder his labs are normal so I'm  not concerned about pancreatitis, hepatitis, renal infection, or other concerns about organ toxicity. CBC with Differential/Platelet; Future -     Comprehensive metabolic panel; Future -     Urinalysis, Routine w reflex microscopic (not [facility]); Future -     CT ABDOMEN PELVIS W CONTRAST; Future   I have discontinued Mr. Glendell Dockerunstall's terbinafine. I am also having him start on aspirin EC. Additionally, I am having him maintain his venlafaxine XR and rosuvastatin.  Meds ordered this encounter  Medications  . rosuvastatin (CRESTOR) 20 MG tablet    Sig: Take 1 tablet (20 mg total) by mouth daily.    Dispense:  90 tablet    Refill:  3  . aspirin EC 81 MG tablet    Sig: Take 1 tablet (81 mg total) by mouth daily.    Dispense:  90 tablet    Refill:  3     Follow-up: Return in about 4 weeks (around 06/22/2016).  Sanda Lingerhomas Siennah Barrasso, MD

## 2016-05-25 NOTE — Progress Notes (Signed)
Pre visit review using our clinic review tool, if applicable. No additional management support is needed unless otherwise documented below in the visit note. 

## 2016-05-29 MED ORDER — ASPIRIN EC 81 MG PO TBEC: 81.0000 mg | DELAYED_RELEASE_TABLET | Freq: Every day | ORAL | 3 refills | Status: DC

## 2016-05-30 ENCOUNTER — Telehealth: Payer: Self-pay | Admitting: Internal Medicine

## 2016-05-30 ENCOUNTER — Other Ambulatory Visit: Payer: Self-pay | Admitting: Internal Medicine

## 2016-05-30 DIAGNOSIS — Z1211 Encounter for screening for malignant neoplasm of colon: Secondary | ICD-10-CM

## 2016-05-30 NOTE — Telephone Encounter (Signed)
Referral entered  

## 2016-05-30 NOTE — Telephone Encounter (Signed)
Patient is requesting referral to GI for routine colonoscopy.   °

## 2016-05-31 ENCOUNTER — Encounter: Payer: Self-pay | Admitting: Internal Medicine

## 2016-05-31 NOTE — Telephone Encounter (Signed)
Can you help or see where they are with scheduling.

## 2016-06-01 ENCOUNTER — Encounter: Payer: Self-pay | Admitting: Gastroenterology

## 2016-06-02 ENCOUNTER — Encounter: Payer: Self-pay | Admitting: Internal Medicine

## 2016-06-02 ENCOUNTER — Ambulatory Visit (INDEPENDENT_AMBULATORY_CARE_PROVIDER_SITE_OTHER)
Admission: RE | Admit: 2016-06-02 | Discharge: 2016-06-02 | Disposition: A | Discharge status: Home / Self Care | Payer: BLUE CROSS/BLUE SHIELD | Source: Ambulatory Visit | Attending: Internal Medicine | Admitting: Internal Medicine

## 2016-06-02 DIAGNOSIS — R10811 Right upper quadrant abdominal tenderness: Secondary | ICD-10-CM

## 2016-06-02 DIAGNOSIS — R1011 Right upper quadrant pain: Secondary | ICD-10-CM | POA: Diagnosis not present

## 2016-06-02 MED ORDER — IOPAMIDOL (ISOVUE-300) INJECTION 61%
100.0000 mL | Freq: Once | INTRAVENOUS | Status: AC | PRN
  Administered 2016-06-02: 100 mL via INTRAVENOUS

## 2016-06-14 ENCOUNTER — Ambulatory Visit (AMBULATORY_SURGERY_CENTER): Payer: Self-pay

## 2016-06-14 ENCOUNTER — Encounter: Payer: Self-pay | Admitting: Gastroenterology

## 2016-06-14 VITALS — Ht 66.0 in | Wt 204.0 lb

## 2016-06-14 DIAGNOSIS — Z1211 Encounter for screening for malignant neoplasm of colon: Secondary | ICD-10-CM

## 2016-06-14 MED ORDER — NA SULFATE-K SULFATE-MG SULF 17.5-3.13-1.6 GM/177ML PO SOLN: ORAL | 0 refills | Status: DC

## 2016-06-14 NOTE — Progress Notes (Signed)
Per pt, no allergies to soy or egg products.Pt not taking any weight loss meds or using  O2 at home. 

## 2016-06-22 ENCOUNTER — Encounter: Payer: Self-pay | Admitting: Gastroenterology

## 2016-06-22 ENCOUNTER — Ambulatory Visit (AMBULATORY_SURGERY_CENTER): Payer: BLUE CROSS/BLUE SHIELD | Admitting: Gastroenterology

## 2016-06-22 VITALS — BP 110/73 | HR 62 | Temp 98.7°F | Resp 15 | Ht 66.0 in | Wt 204.0 lb

## 2016-06-22 DIAGNOSIS — Z1212 Encounter for screening for malignant neoplasm of rectum: Secondary | ICD-10-CM | POA: Diagnosis not present

## 2016-06-22 DIAGNOSIS — Z1211 Encounter for screening for malignant neoplasm of colon: Secondary | ICD-10-CM | POA: Diagnosis present

## 2016-06-22 LAB — HM COLONOSCOPY

## 2016-06-22 MED ORDER — SODIUM CHLORIDE 0.9 % IV SOLN: 500.0000 mL | INTRAVENOUS | Status: DC

## 2016-06-22 NOTE — Progress Notes (Signed)
A/ox3 pleased with MAC, report to Michelle RN 

## 2016-06-22 NOTE — Patient Instructions (Signed)
YOU HAD AN ENDOSCOPIC PROCEDURE TODAY AT THE [facility]:   Refer to the procedure report that was given to you for any specific questions about what was found during the examination.  If the procedure report does not answer your questions, please call your gastroenterologist to clarify.  If you requested that your care partner not be given the details of your procedure findings, then the procedure report has been included in a sealed envelope for you to review at your convenience later.  YOU SHOULD EXPECT: Some feelings of bloating in the abdomen. Passage of more gas than usual.  Walking can help get rid of the air that was put into your GI tract during the procedure and reduce the bloating. If you had a lower endoscopy (such as a colonoscopy or flexible sigmoidoscopy) you may notice spotting of blood in your stool or on the toilet paper. If you underwent a bowel prep for your procedure, you may not have a normal bowel movement for a few days.  Please Note:  You might notice some irritation and congestion in your nose or some drainage.  This is from the oxygen used during your procedure.  There is no need for concern and it should clear up in a day or so.  SYMPTOMS TO REPORT IMMEDIATELY:   Following lower endoscopy (colonoscopy or flexible sigmoidoscopy):  Excessive amounts of blood in the stool  Significant tenderness or worsening of abdominal pains  Swelling of the abdomen that is new, acute  Fever of 100F or highe  For urgent or emergent issues, a gastroenterologist can be reached at any hour by calling [phone].   DIET:  We do recommend a small meal at first, but then you may proceed to your regular diet.  Drink plenty of fluids but you should avoid alcoholic beverages for 24 hours.  ACTIVITY:  You should plan to take it easy for the rest of today and you should NOT DRIVE or use heavy machinery until tomorrow (because of the sedation medicines used during the test).     FOLLOW UP: Our staff will call the number listed on your records the next business day following your procedure to check on you and address any questions or concerns that you may have regarding the information given to you following your procedure. If we do not reach you, we will leave a message.  However, if you are feeling well and you are not experiencing any problems, there is no need to return our call.  We will assume that you have returned to your regular daily activities without incident.  If any biopsies were taken you will be contacted by phone or by letter within the next 1-3 weeks.  Please call us at [phone] if you have not heard about the biopsies in 3 weeks.    SIGNATURES/CONFIDENTIALITY: You and/or your care partner have signed paperwork which will be entered into your electronic medical record.  These signatures attest to the fact that that the information above on your After Visit Summary has been reviewed and is understood.  Full responsibility of the confidentiality of this discharge information lies with you and/or your care-partner.   Hemorrhoids (handout given) Diverticulosis (handout given) Repeat Colonoscopy in 10 years

## 2016-06-22 NOTE — Op Note (Signed)
[facility] Patient Name: Migdalia DkJames Gettis Procedure Date: 06/22/2016 4:06 PM MRN: [id number] Endoscopist: Napoleon FormKavitha V. Dusten Ellinwood , MD Age: 50 Referring MD:  Date of Birth: [birth year] Gender: Male Account #: [id number] Procedure:                Colonoscopy Indications:              Screening for malignant neoplasm in the rectum,                            This is the patient's first colonoscopy Medicines:                Monitored Anesthesia Care Procedure:                Pre-Anesthesia Assessment:                           - Prior to the procedure, a History and Physical                            was performed, and patient medications and                            allergies were reviewed. The patient's tolerance of                            previous anesthesia was also reviewed. The risks                            and benefits of the procedure and the sedation                            options and risks were discussed with the patient.                            All questions were answered, and informed consent                            was obtained. Prior Anticoagulants: The patient has                            taken no previous anticoagulant or antiplatelet                            agents. ASA Grade Assessment: II - A patient with                            mild systemic disease. After reviewing the risks                            and benefits, the patient was deemed in                            satisfactory condition to undergo the procedure.  After obtaining informed consent, the colonoscope                            was passed under direct vision. Throughout the                            procedure, the patient's blood pressure, pulse, and                            oxygen saturations were monitored continuously. The                            Model PCF-[id number](SN#[id number]) scope was introduced                            through the  anus and advanced to the the cecum,                            identified by appendiceal orifice and ileocecal                            valve. The colonoscopy was performed without                            difficulty. The patient tolerated the procedure                            well. The quality of the bowel preparation was                            good. The ileocecal valve, appendiceal orifice, and                            rectum were photographed. Scope In: 4:09:28 PM Scope Out: 4:25:06 PM Scope Withdrawal Time: 0 hours 12 minutes 27 seconds  Total Procedure Duration: 0 hours 15 minutes 38 seconds  Findings:                 The perianal and digital rectal examinations were                            normal.                           Non-bleeding internal hemorrhoids were found during                            retroflexion. The hemorrhoids were small.                           A few small-mouthed diverticula were found in the                            sigmoid colon.  The exam was otherwise without abnormality. Complications:            No immediate complications. Estimated Blood Loss:     Estimated blood loss: none. Impression:               - Non-bleeding internal hemorrhoids.                           - Diverticulosis in the sigmoid colon.                           - The examination was otherwise normal.                           - No specimens collected. Recommendation:           - Patient has a contact number available for                            emergencies. The signs and symptoms of potential                            delayed complications were discussed with the                            patient. Return to normal activities tomorrow.                            Written discharge instructions were provided to the                            patient.                           - Resume previous diet.                           - Continue  present medications.                           - Repeat colonoscopy in 10 years for screening                            purposes.                           - Return to GI clinic PRN. Napoleon Form, MD 06/22/2016 4:30:33 PM This report has been signed electronically.

## 2016-06-23 ENCOUNTER — Telehealth: Payer: Self-pay

## 2016-06-23 NOTE — Telephone Encounter (Signed)
  Follow up Call-  Call back number 06/22/2016  Post procedure Call Back phone  # [phone]  Permission to leave phone message Yes  Some recent data might be hidden     Patient questions:  Do you have a fever, pain , or abdominal swelling? No. Pain Score  0 *  Have you tolerated food without any problems? Yes.    Have you been able to return to your normal activities? Yes.    Do you have any questions about your discharge instructions: Diet   No. Medications  No. Follow up visit  No.  Do you have questions or concerns about your Care? No.  Actions: * If pain score is 4 or above: No action needed, pain <4.

## 2016-07-11 ENCOUNTER — Encounter: Payer: Self-pay | Admitting: Internal Medicine

## 2016-07-11 ENCOUNTER — Telehealth: Payer: Self-pay | Admitting: Internal Medicine

## 2016-07-11 NOTE — Telephone Encounter (Signed)
yes

## 2016-07-11 NOTE — Telephone Encounter (Signed)
Patient states that Dr. Yetta BarreJones wanted him to have EKG.  Patient wanted to ask to see if Dr. Yetta BarreJones would work him in around 8/8:30am some time this week.

## 2016-07-11 NOTE — Telephone Encounter (Signed)
Do you want to work pt in this week?

## 2016-07-11 NOTE — Telephone Encounter (Signed)
LVM for pt to call back as soon as possible.    RE: Pt needs an appt for this week per TJ.

## 2016-07-12 NOTE — Telephone Encounter (Signed)
appt made

## 2016-07-13 ENCOUNTER — Ambulatory Visit: Payer: BLUE CROSS/BLUE SHIELD | Admitting: Internal Medicine

## 2016-07-20 ENCOUNTER — Ambulatory Visit: Payer: BLUE CROSS/BLUE SHIELD | Admitting: Internal Medicine

## 2016-09-24 ENCOUNTER — Other Ambulatory Visit: Payer: Self-pay | Admitting: Internal Medicine

## 2016-09-24 DIAGNOSIS — F32A Depression, unspecified: Secondary | ICD-10-CM

## 2016-09-24 DIAGNOSIS — F329 Major depressive disorder, single episode, unspecified: Secondary | ICD-10-CM

## 2016-10-10 ENCOUNTER — Encounter: Payer: Self-pay | Admitting: Internal Medicine

## 2016-12-21 ENCOUNTER — Ambulatory Visit (INDEPENDENT_AMBULATORY_CARE_PROVIDER_SITE_OTHER): Payer: BLUE CROSS/BLUE SHIELD | Admitting: Internal Medicine

## 2016-12-21 ENCOUNTER — Encounter: Payer: Self-pay | Admitting: Internal Medicine

## 2016-12-21 ENCOUNTER — Other Ambulatory Visit (INDEPENDENT_AMBULATORY_CARE_PROVIDER_SITE_OTHER): Payer: BLUE CROSS/BLUE SHIELD

## 2016-12-21 VITALS — BP 120/80 | HR 101 | Temp 98.9°F | Resp 16 | Ht 66.0 in | Wt 202.0 lb

## 2016-12-21 DIAGNOSIS — R9431 Abnormal electrocardiogram [ECG] [EKG]: Secondary | ICD-10-CM | POA: Diagnosis not present

## 2016-12-21 DIAGNOSIS — Z136 Encounter for screening for cardiovascular disorders: Secondary | ICD-10-CM | POA: Diagnosis not present

## 2016-12-21 LAB — CARDIAC PANEL
CK MB: 2.4 ng/mL (ref 0.3–4.0)
RELATIVE INDEX: 1.9 calc (ref 0.0–2.5)
Total CK: 126 U/L (ref 7–232)

## 2016-12-21 LAB — TROPONIN I: TNIDX: 0 ug/L (ref 0.00–0.06)

## 2016-12-21 NOTE — Progress Notes (Signed)
Subjective:  Patient ID: Ethan Crosby, male    DOB: 1965/10/19  Age: 52 y.o. MRN: [id number]  CC: Arm Pain   HPI Ethan Crosby presents for concerns about an episode of left upper arm pain that occurred one day prior to this visit. He was sitting in his office and describes the pain around his left mid biceps- he says it felt like someone had punched him. It lasted briefly and then resolved. He felt anxious about it but did not develop chest pain, shortness of breath, diaphoresis, dizziness, nausea, or near-syncope. He has had no exertional symptoms over the last day. He feels overwhelmed and anxious about his life.  Outpatient Medications Prior to Visit  Medication Sig Dispense Refill  . aspirin EC 81 MG tablet Take 1 tablet (81 mg total) by mouth daily. 90 tablet 3  . Multiple Vitamin (MULTIVITAMIN) tablet Take 1 tablet by mouth daily.    . rosuvastatin (CRESTOR) 20 MG tablet Take 1 tablet (20 mg total) by mouth daily. 90 tablet 3  . venlafaxine XR (EFFEXOR-XR) 75 MG 24 hr capsule TAKE (1) CAPSULE DAILY. 90 capsule 3  . 0.9 %  sodium chloride infusion      No facility-administered medications prior to visit.     ROS Review of Systems  Constitutional: Negative for diaphoresis and fatigue.  HENT: Negative.   Eyes: Negative.  Negative for visual disturbance.  Respiratory: Negative for choking, chest tightness, shortness of breath, wheezing and stridor.   Cardiovascular: Negative for chest pain, palpitations and leg swelling.  Gastrointestinal: Negative for abdominal pain, constipation, diarrhea, nausea and vomiting.  Genitourinary: Negative.   Musculoskeletal: Negative for arthralgias, myalgias and neck pain.  Skin: Negative.   Neurological: Negative.  Negative for dizziness.  Hematological: Negative for adenopathy. Does not bruise/bleed easily.  Psychiatric/Behavioral: Negative.     Objective:  BP 120/80 (BP Location: Right Arm, Patient Position: Sitting, Cuff Size:  Large)   Pulse (!) 101   Temp 98.9 F (37.2 C) (Oral)   Resp 16   Ht 5\' 6"  (1.676 m)   Wt 202 lb (91.6 kg)   SpO2 99%   BMI 32.60 kg/m   BP Readings from Last 3 Encounters:  12/21/16 120/80  06/22/16 110/73  05/25/16 118/78    Wt Readings from Last 3 Encounters:  12/21/16 202 lb (91.6 kg)  06/22/16 204 lb (92.5 kg)  06/14/16 204 lb (92.5 kg)    Physical Exam  Constitutional: He is oriented to person, place, and time. No distress.  HENT:  Mouth/Throat: Oropharynx is clear and moist. No oropharyngeal exudate.  Eyes: Conjunctivae are normal. Right eye exhibits no discharge. No scleral icterus.  Neck: Normal range of motion. Neck supple. No JVD present. No thyromegaly present.  Cardiovascular: Normal rate, regular rhythm, normal heart sounds and intact distal pulses.  Exam reveals no gallop and no friction rub.   No murmur heard. EKG--  Sinus  Rhythm  -  Nonspecific T-abnormality.   ABNORMAL - no old EKG for comparison   Pulmonary/Chest: Effort normal and breath sounds normal. No respiratory distress. He has no wheezes. He has no rales. He exhibits no tenderness.  Abdominal: Soft. Bowel sounds are normal. He exhibits no distension and no mass. There is no tenderness. There is no rebound and no guarding.  Musculoskeletal: Normal range of motion. He exhibits no edema, tenderness or deformity.       Left upper arm: Normal. He exhibits no tenderness, no bony tenderness, no swelling, no  edema, no deformity and no laceration.  Lymphadenopathy:    He has no cervical adenopathy.  Neurological: He is alert and oriented to person, place, and time.  Skin: Skin is warm and dry. No rash noted. He is not diaphoretic. No erythema. No pallor.  Psychiatric:  He is anxious and tearful today  Vitals reviewed.   Lab Results  Component Value Date   WBC 11.0 (H) 05/25/2016   HGB 17.1 (H) 05/25/2016   HCT 49.1 05/25/2016   PLT 246.0 05/25/2016   GLUCOSE 99 05/25/2016   CHOL 262 (H)  05/25/2016   TRIG 83.0 05/25/2016   HDL 49.00 05/25/2016   LDLDIRECT 236.0 12/30/2015   LDLCALC 197 (H) 05/25/2016   ALT 25 05/25/2016   AST 21 05/25/2016   NA 139 05/25/2016   K 4.6 05/25/2016   CL 102 05/25/2016   CREATININE 0.92 05/25/2016   BUN 17 05/25/2016   CO2 27 05/25/2016   TSH 1.04 02/03/2016   PSA 1.03 12/30/2015   HGBA1C 5.3 12/30/2015    Ct Abdomen Pelvis W Contrast  Result Date: 06/02/2016 CLINICAL DATA:  Right upper quadrant abdominal pain and tenderness for 2 weeks. Elevated white blood cell count. EXAM: CT ABDOMEN AND PELVIS WITH CONTRAST TECHNIQUE: Multidetector CT imaging of the abdomen and pelvis was performed using the standard protocol following bolus administration of intravenous contrast. CONTRAST:  100mL ISOVUE-300 IOPAMIDOL (ISOVUE-300) INJECTION 61% COMPARISON:  None. FINDINGS: Lower chest: Clear lung bases.  Heart normal in size. Hepatobiliary: No focal liver abnormality is seen. No gallstones, gallbladder wall thickening, or biliary dilatation. Gallbladder is mostly decompressed. Pancreas: Unremarkable. No pancreatic ductal dilatation or surrounding inflammatory changes. Spleen: Normal in size without focal abnormality. Adrenals/Urinary Tract: No adrenal masses. 5.9 cm lower pole right renal cyst. No other renal masses. Small nonobstructing stone in the midpole the right kidney. No other intrarenal stones. No hydronephrosis. Normal ureters. Normal bladder. Stomach/Bowel: Stomach is within normal limits. Appendix appears normal. No evidence of bowel wall thickening, distention, or inflammatory changes. Vascular/Lymphatic: No significant vascular findings are present. No enlarged abdominal or pelvic lymph nodes. Reproductive: Unremarkable Other: No abdominal wall hernia or abnormality. No abdominopelvic ascites. Musculoskeletal: Status post interbody fusion at L4-L5. No fracture or acute bony abnormality. No osteoblastic or osteolytic lesions. IMPRESSION: 1. No acute  findings. No findings to account for right upper quadrant abdominal pain or elevated white blood cell count. Normal appendix visualized. Electronically Signed   By: Amie Portlandavid  Ormond M.D.   On: 06/02/2016 17:06    Assessment & Plan:   [name] was seen today for arm pain.  Diagnoses and all orders for this visit:  Ischemic heart disease screen -     EKG 12-Lead  Nonspecific abnormal electrocardiogram (ECG) (EKG)- he had left upper arm pain but no cardiovascular symptoms, his EKG shows nonspecific flattening of the T waves laterally, there is nothing suspicious for ischemia or MI. Recheck enzymes are normal. I reassured him that I think this is an episode of musculoskeletal pain complicated by anxiety. He was reassured and will let me know if he develops any new symptoms. -     Troponin I; Future -     Cardiac panel; Future   I am having Mr. Judeth Porchunstall maintain his rosuvastatin, aspirin EC, multivitamin, and venlafaxine XR. We will stop administering sodium chloride.  No orders of the defined types were placed in this encounter.    Follow-up: No Follow-up on file.  Sanda Lingerhomas Eniola Cerullo, MD

## 2016-12-22 ENCOUNTER — Encounter: Payer: Self-pay | Admitting: Internal Medicine

## 2016-12-23 NOTE — Patient Instructions (Signed)
Panic Attacks Panic attacks are sudden, short-livedsurges of severe anxiety, fear, or discomfort. They may occur for no reason when you are relaxed, when you are anxious, or when you are sleeping. Panic attacks may occur for a number of reasons:  Healthy people occasionally have panic attacks in extreme, life-threatening situations, such as war or natural disasters. Normal anxiety is a protective mechanism of the body that helps us react to danger (fight or flight response).  Panic attacks are often seen with anxiety disorders, such as panic disorder, social anxiety disorder, generalized anxiety disorder, and phobias. Anxiety disorders cause excessive or uncontrollable anxiety. They may interfere with your relationships or other life activities.  Panic attacks are sometimes seen with other mental illnesses, such as depression and posttraumatic stress disorder.  Certain medical conditions, prescription medicines, and drugs of abuse can cause panic attacks. What are the signs or symptoms? Panic attacks start suddenly, peak within 20 minutes, and are accompanied by four or more of the following symptoms:  Pounding heart or fast heart rate (palpitations).  Sweating.  Trembling or shaking.  Shortness of breath or feeling smothered.  Feeling choked.  Chest pain or discomfort.  Nausea or strange feeling in your stomach.  Dizziness, light-headedness, or feeling like you will faint.  Chills or hot flushes.  Numbness or tingling in your lips or hands and feet.  Feeling that things are not real or feeling that you are not yourself.  Fear of losing control or going crazy.  Fear of dying. Some of these symptoms can mimic serious medical conditions. For example, you may think you are having a heart attack. Although panic attacks can be very scary, they are not life threatening. How is this diagnosed? Panic attacks are diagnosed through an assessment by your health care provider. Your  health care provider will ask questions about your symptoms, such as where and when they occurred. Your health care provider will also ask about your medical history and use of alcohol and drugs, including prescription medicines. Your health care provider may order blood tests or other studies to rule out a serious medical condition. Your health care provider may refer you to a mental health professional for further evaluation. How is this treated?  Most healthy people who have one or two panic attacks in an extreme, life-threatening situation will not require treatment.  The treatment for panic attacks associated with anxiety disorders or other mental illness typically involves counseling with a mental health professional, medicine, or a combination of both. Your health care provider will help determine what treatment is best for you.  Panic attacks due to physical illness usually go away with treatment of the illness. If prescription medicine is causing panic attacks, talk with your health care provider about stopping the medicine, decreasing the dose, or substituting another medicine.  Panic attacks due to alcohol or drug abuse go away with abstinence. Some adults need professional help in order to stop drinking or using drugs. Follow these instructions at home:  Take all medicines as directed by your health care provider.  Schedule and attend follow-up visits as directed by your health care provider. It is important to keep all your appointments. Contact a health care provider if:  You are not able to take your medicines as prescribed.  Your symptoms do not improve or get worse. Get help right away if:  You experience panic attack symptoms that are different than your usual symptoms.  You have serious thoughts about hurting yourself or others.    You are taking medicine for panic attacks and have a serious side effect. This information is not intended to replace advice given to you by  your health care provider. Make sure you discuss any questions you have with your health care provider. Document Released: 06/20/2005 Document Revised: 11/26/2015 Document Reviewed: 02/01/2013 [employer] Interactive Patient Education  2017 [employer]  

## 2017-01-03 ENCOUNTER — Encounter: Payer: Self-pay | Admitting: Internal Medicine

## 2017-01-03 ENCOUNTER — Other Ambulatory Visit (INDEPENDENT_AMBULATORY_CARE_PROVIDER_SITE_OTHER): Payer: BLUE CROSS/BLUE SHIELD

## 2017-01-03 ENCOUNTER — Ambulatory Visit (INDEPENDENT_AMBULATORY_CARE_PROVIDER_SITE_OTHER): Payer: BLUE CROSS/BLUE SHIELD | Admitting: Internal Medicine

## 2017-01-03 VITALS — BP 128/78 | HR 80 | Temp 97.8°F | Resp 16 | Ht 66.0 in | Wt 202.5 lb

## 2017-01-03 DIAGNOSIS — Z23 Encounter for immunization: Secondary | ICD-10-CM | POA: Diagnosis not present

## 2017-01-03 DIAGNOSIS — Z0000 Encounter for general adult medical examination without abnormal findings: Secondary | ICD-10-CM | POA: Diagnosis not present

## 2017-01-03 DIAGNOSIS — N400 Enlarged prostate without lower urinary tract symptoms: Secondary | ICD-10-CM

## 2017-01-03 DIAGNOSIS — E785 Hyperlipidemia, unspecified: Secondary | ICD-10-CM | POA: Diagnosis not present

## 2017-01-03 LAB — LIPID PANEL
CHOLESTEROL: 129 mg/dL (ref 0–200)
HDL: 37.3 mg/dL — AB (ref >39.00)
LDL Cholesterol: 58 mg/dL (ref 0–99)
NonHDL: 91.63
Total CHOL/HDL Ratio: 3
Triglycerides: 166 mg/dL — ABNORMAL HIGH (ref 0.0–149.0)
VLDL: 33.2 mg/dL (ref 0.0–40.0)

## 2017-01-03 LAB — COMPREHENSIVE METABOLIC PANEL
ALBUMIN: 4.8 g/dL (ref 3.5–5.2)
ALK PHOS: 58 U/L (ref 39–117)
ALT: 27 U/L (ref 0–53)
AST: 24 U/L (ref 0–37)
BUN: 12 mg/dL (ref 6–23)
CALCIUM: 9.5 mg/dL (ref 8.4–10.5)
CO2: 25 mEq/L (ref 19–32)
Chloride: 107 mEq/L (ref 96–112)
Creatinine, Ser: 0.86 mg/dL (ref 0.40–1.50)
GFR: 99.77 mL/min (ref >60.00)
Glucose, Bld: 89 mg/dL (ref 70–99)
POTASSIUM: 4.6 meq/L (ref 3.5–5.1)
Sodium: 138 mEq/L (ref 135–145)
TOTAL PROTEIN: 7.2 g/dL (ref 6.0–8.3)
Total Bilirubin: 0.4 mg/dL (ref 0.2–1.2)

## 2017-01-03 LAB — URINALYSIS, ROUTINE W REFLEX MICROSCOPIC
BILIRUBIN URINE: NEGATIVE
Hgb urine dipstick: NEGATIVE
KETONES UR: NEGATIVE
LEUKOCYTES UA: NEGATIVE
NITRITE: NEGATIVE
RBC / HPF: NONE SEEN (ref 0–?)
Specific Gravity, Urine: <=1.005 — AB (ref 1.000–1.030)
TOTAL PROTEIN, URINE-UPE24: NEGATIVE
Urine Glucose: NEGATIVE
Urobilinogen, UA: 0.2 (ref 0.0–1.0)
WBC, UA: NONE SEEN (ref 0–?)
pH: 7 (ref 5.0–8.0)

## 2017-01-03 LAB — CBC WITH DIFFERENTIAL/PLATELET
Basophils Absolute: 0.1 10*3/uL (ref 0.0–0.1)
Basophils Relative: 1.4 % (ref 0.0–3.0)
EOS PCT: 5.6 % — AB (ref 0.0–5.0)
Eosinophils Absolute: 0.5 10*3/uL (ref 0.0–0.7)
HCT: 46.1 % (ref 39.0–52.0)
HEMOGLOBIN: 15.9 g/dL (ref 13.0–17.0)
Lymphocytes Relative: 36.1 % (ref 12.0–46.0)
Lymphs Abs: 2.9 10*3/uL (ref 0.7–4.0)
MCHC: 34.6 g/dL (ref 30.0–36.0)
MCV: 84.6 fl (ref 78.0–100.0)
MONOS PCT: 9 % (ref 3.0–12.0)
Monocytes Absolute: 0.7 10*3/uL (ref 0.1–1.0)
Neutro Abs: 3.9 10*3/uL (ref 1.4–7.7)
Neutrophils Relative %: 47.9 % (ref 43.0–77.0)
Platelets: 194 10*3/uL (ref 150.0–400.0)
RBC: 5.44 Mil/uL (ref 4.22–5.81)
RDW: 13.5 % (ref 11.5–15.5)
WBC: 8.1 10*3/uL (ref 4.0–10.5)

## 2017-01-03 LAB — PSA: PSA: 1.34 ng/mL (ref 0.10–4.00)

## 2017-01-03 NOTE — Progress Notes (Signed)
Subjective:  Patient ID: Ethan Crosby, male    DOB: 05-05-1966  Age: 51 y.o. MRN: [id number]  CC: Annual Exam and Hyperlipidemia   HPI Ethan Crosby presents for a CPX. He was recently seen for left upper arm pain. He said the symptoms have resolved. He has had no recent episodes of DOE, CP, edema, palpitations, or fatigue. He feels well today and offers no complaints.  Outpatient Medications Prior to Visit  Medication Sig Dispense Refill  . aspirin EC 81 MG tablet Take 1 tablet (81 mg total) by mouth daily. 90 tablet 3  . Multiple Vitamin (MULTIVITAMIN) tablet Take 1 tablet by mouth daily.    . rosuvastatin (CRESTOR) 20 MG tablet Take 1 tablet (20 mg total) by mouth daily. 90 tablet 3  . venlafaxine XR (EFFEXOR-XR) 75 MG 24 hr capsule TAKE (1) CAPSULE DAILY. 90 capsule 3   No facility-administered medications prior to visit.     ROS Review of Systems  Constitutional: Negative.  Negative for diaphoresis and fatigue.  HENT: Negative.   Eyes: Negative.   Respiratory: Negative.  Negative for chest tightness, shortness of breath, wheezing and stridor.   Cardiovascular: Negative.  Negative for chest pain, palpitations and leg swelling.  Gastrointestinal: Negative for abdominal pain, constipation, diarrhea, nausea and vomiting.  Endocrine: Negative.   Genitourinary: Negative.  Negative for difficulty urinating, discharge, frequency, genital sores, penile swelling, scrotal swelling, testicular pain and urgency.  Musculoskeletal: Negative.  Negative for back pain and myalgias.  Allergic/Immunologic: Negative.   Neurological: Negative.  Negative for dizziness, weakness and headaches.  Hematological: Negative.  Negative for adenopathy. Does not bruise/bleed easily.  Psychiatric/Behavioral: Negative.     Objective:  BP 128/78 (BP Location: Left Arm, Patient Position: Sitting, Cuff Size: Normal)   Pulse 80   Temp 97.8 F (36.6 C) (Oral)   Resp 16   Ht 5\' 6"  (1.676 m)   Wt 202  lb 8 oz (91.9 kg)   SpO2 99%   BMI 32.68 kg/m   BP Readings from Last 3 Encounters:  01/03/17 128/78  12/21/16 120/80  06/22/16 110/73    Wt Readings from Last 3 Encounters:  01/03/17 202 lb 8 oz (91.9 kg)  12/21/16 202 lb (91.6 kg)  06/22/16 204 lb (92.5 kg)    Physical Exam  Constitutional: He is oriented to person, place, and time. No distress.  HENT:  Mouth/Throat: Oropharynx is clear and moist. No oropharyngeal exudate.  Eyes: Conjunctivae are normal. Right eye exhibits no discharge. Left eye exhibits no discharge. No scleral icterus.  Neck: Normal range of motion. Neck supple. No JVD present. No thyromegaly present.  Cardiovascular: Normal rate, regular rhythm and intact distal pulses.  Exam reveals no gallop and no friction rub.   No murmur heard. Pulmonary/Chest: Effort normal and breath sounds normal. No respiratory distress. He has no wheezes. He has no rales. He exhibits no tenderness.  Abdominal: Soft. Bowel sounds are normal. He exhibits no distension and no mass. There is no tenderness. There is no rebound and no guarding. Hernia confirmed negative in the right inguinal area and confirmed negative in the left inguinal area.  Genitourinary: Rectum normal, testes normal and penis normal. Rectal exam shows no external hemorrhoid, no internal hemorrhoid, no fissure, no mass, no tenderness, anal tone normal and guaiac negative stool. Prostate is enlarged (1+ smooth symm BPH). Prostate is not tender. Right testis shows no mass, no swelling and no tenderness. Right testis is descended. Left testis shows no mass,  no swelling and no tenderness. Left testis is descended. Circumcised. No penile erythema or penile tenderness. No discharge found.  Musculoskeletal: Normal range of motion. He exhibits no edema or tenderness.  Lymphadenopathy:    He has no cervical adenopathy.       Right: No inguinal adenopathy present.       Left: No inguinal adenopathy present.  Neurological: He is  alert and oriented to person, place, and time.  Skin: Skin is warm and dry. No rash noted. He is not diaphoretic. No erythema. No pallor.  Psychiatric: He has a normal mood and affect. His behavior is normal. Judgment and thought content normal.  Vitals reviewed.   Lab Results  Component Value Date   WBC 8.1 01/03/2017   HGB 15.9 01/03/2017   HCT 46.1 01/03/2017   PLT 194.0 01/03/2017   GLUCOSE 89 01/03/2017   CHOL 129 01/03/2017   TRIG 166.0 (H) 01/03/2017   HDL 37.30 (L) 01/03/2017   LDLDIRECT 236.0 12/30/2015   LDLCALC 58 01/03/2017   ALT 27 01/03/2017   AST 24 01/03/2017   NA 138 01/03/2017   K 4.6 01/03/2017   CL 107 01/03/2017   CREATININE 0.86 01/03/2017   BUN 12 01/03/2017   CO2 25 01/03/2017   TSH 1.04 02/03/2016   PSA 1.34 01/03/2017   HGBA1C 5.3 12/30/2015    Ct Abdomen Pelvis W Contrast  Result Date: 06/02/2016 CLINICAL DATA:  Right upper quadrant abdominal pain and tenderness for 2 weeks. Elevated white blood cell count. EXAM: CT ABDOMEN AND PELVIS WITH CONTRAST TECHNIQUE: Multidetector CT imaging of the abdomen and pelvis was performed using the standard protocol following bolus administration of intravenous contrast. CONTRAST:  100mL ISOVUE-300 IOPAMIDOL (ISOVUE-300) INJECTION 61% COMPARISON:  None. FINDINGS: Lower chest: Clear lung bases.  Heart normal in size. Hepatobiliary: No focal liver abnormality is seen. No gallstones, gallbladder wall thickening, or biliary dilatation. Gallbladder is mostly decompressed. Pancreas: Unremarkable. No pancreatic ductal dilatation or surrounding inflammatory changes. Spleen: Normal in size without focal abnormality. Adrenals/Urinary Tract: No adrenal masses. 5.9 cm lower pole right renal cyst. No other renal masses. Small nonobstructing stone in the midpole the right kidney. No other intrarenal stones. No hydronephrosis. Normal ureters. Normal bladder. Stomach/Bowel: Stomach is within normal limits. Appendix appears normal. No  evidence of bowel wall thickening, distention, or inflammatory changes. Vascular/Lymphatic: No significant vascular findings are present. No enlarged abdominal or pelvic lymph nodes. Reproductive: Unremarkable Other: No abdominal wall hernia or abnormality. No abdominopelvic ascites. Musculoskeletal: Status post interbody fusion at L4-L5. No fracture or acute bony abnormality. No osteoblastic or osteolytic lesions. IMPRESSION: 1. No acute findings. No findings to account for right upper quadrant abdominal pain or elevated white blood cell count. Normal appendix visualized. Electronically Signed   By: Amie Portlandavid  Ormond M.D.   On: 06/02/2016 17:06    Assessment & Plan:   [name] was seen today for annual exam and hyperlipidemia.  Diagnoses and all orders for this visit:  Need for Tdap vaccination -     Tdap vaccine greater than or equal to 7yo IM  Routine general medical examination at a health care facility- Exam completed, labs ordered and reviewed, colon cancer screening is up-to-date, vaccines reviewed and updated, patient education material was given. -     Lipid panel; Future -     PSA; Future  Hyperlipidemia with target LDL less than 100- he has achieved his LDL goal and is doing well on the statin. Will continue. -  Comprehensive metabolic panel; Future -     CBC with Differential/Platelet; Future  Benign prostatic hyperplasia without lower urinary tract symptoms- his PSA is not elevated so not concerned about prostate cancer. He has no symptoms that need to be treated. -     Urinalysis, Routine w reflex microscopic; Future   I am having Mr. Denn maintain his rosuvastatin, aspirin EC, multivitamin, and venlafaxine XR.  No orders of the defined types were placed in this encounter.    Follow-up: Return in about 1 year (around 01/03/2018).  Sanda Linger, MD

## 2017-01-03 NOTE — Patient Instructions (Signed)

## 2017-01-28 IMAGING — US US SCROTUM
1 series · 14 of 25 positions shown · non-contrast
Comparison: None.

CLINICAL DATA: LEFT epididymal mass, LEFT epididymitis

EXAM:
ULTRASOUND OF SCROTUM
TECHNIQUE: Complete ultrasound examination of the testicles, epididymis, and
other scrotal structures was performed.

[Series 1: us scrotum · 0.08mm/px · 14 of 45 slices shown]
[im 1/45]
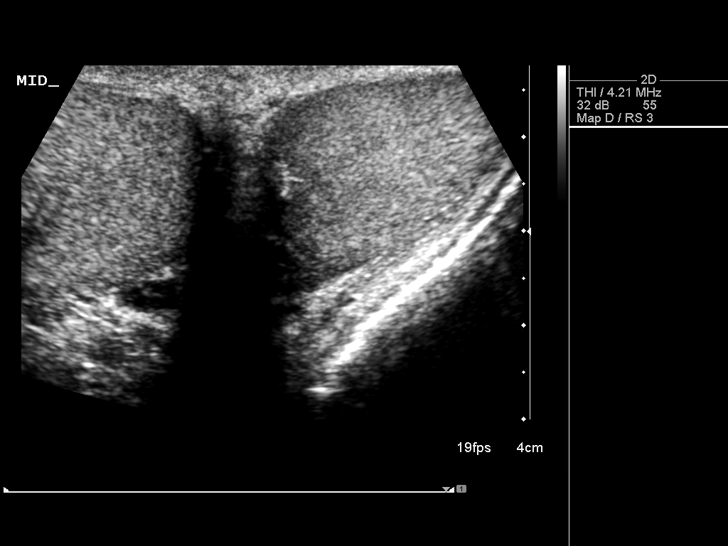
[im 4/45]
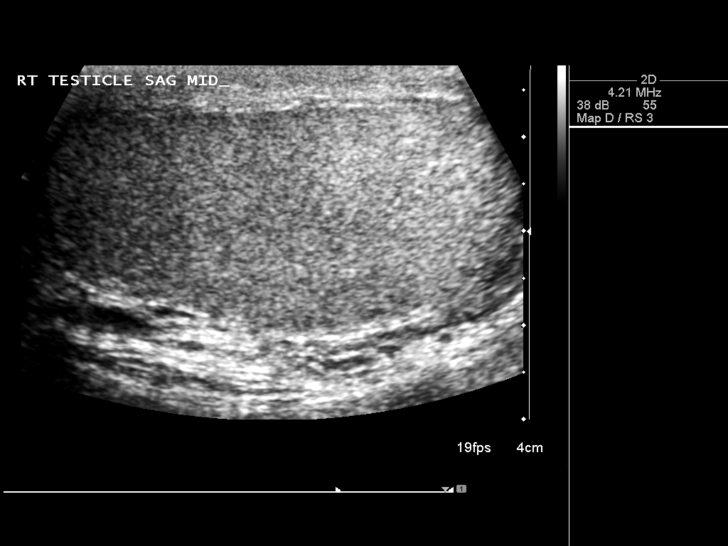
[im 8/45]
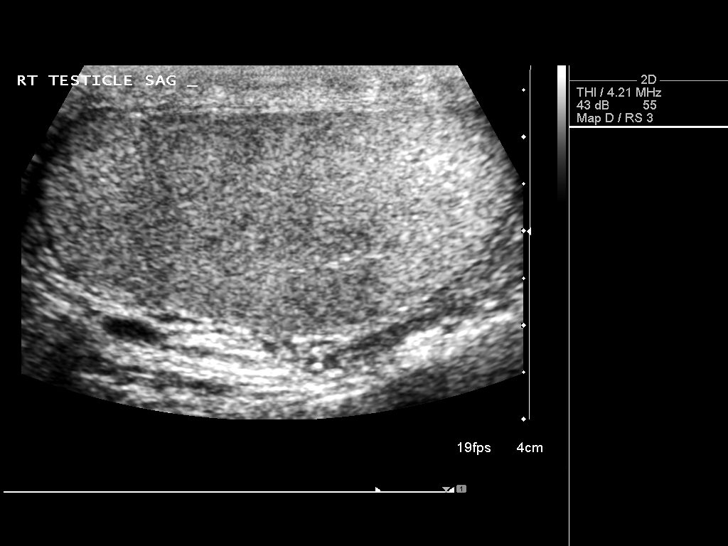
[im 12/45]
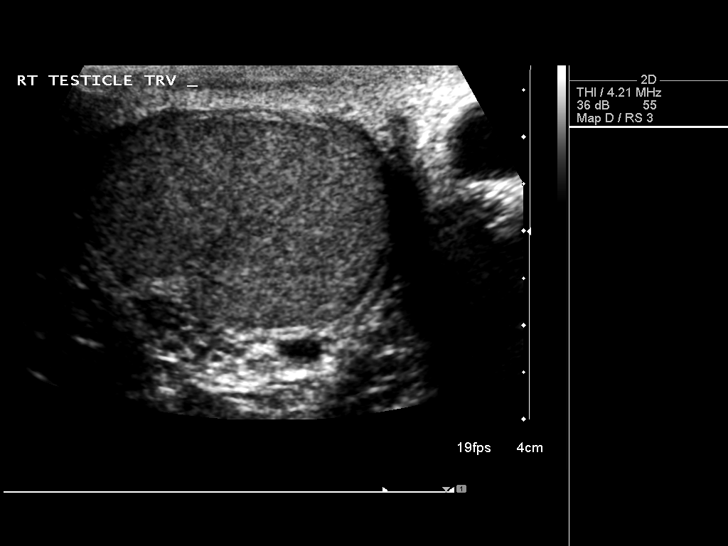
[im 15/45]
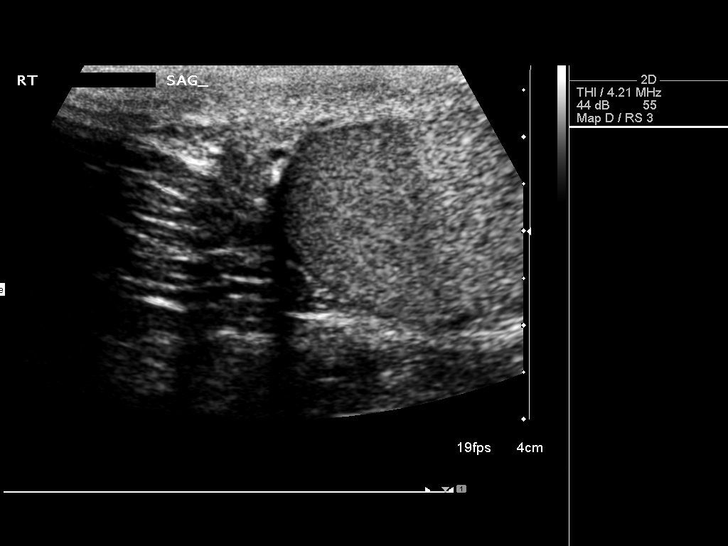
[im 17/45]
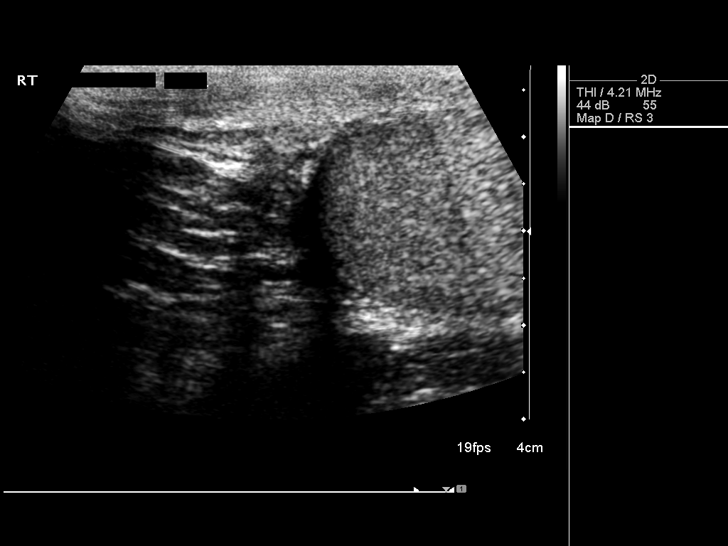
[im 21/45]
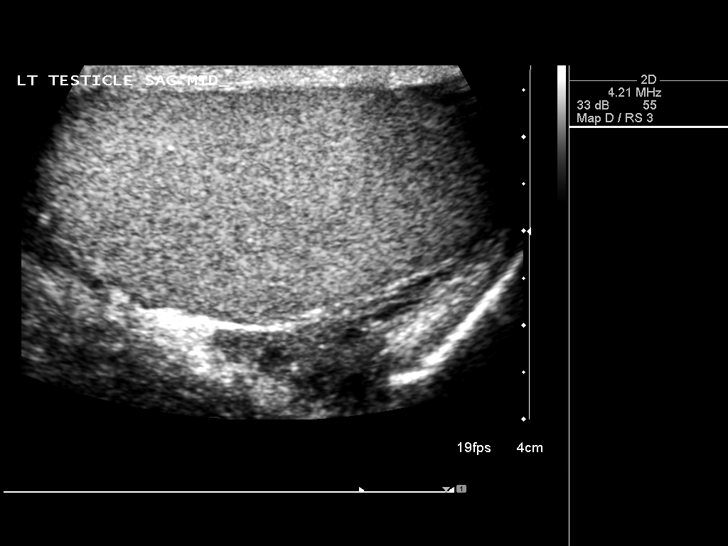
[im 24/45]
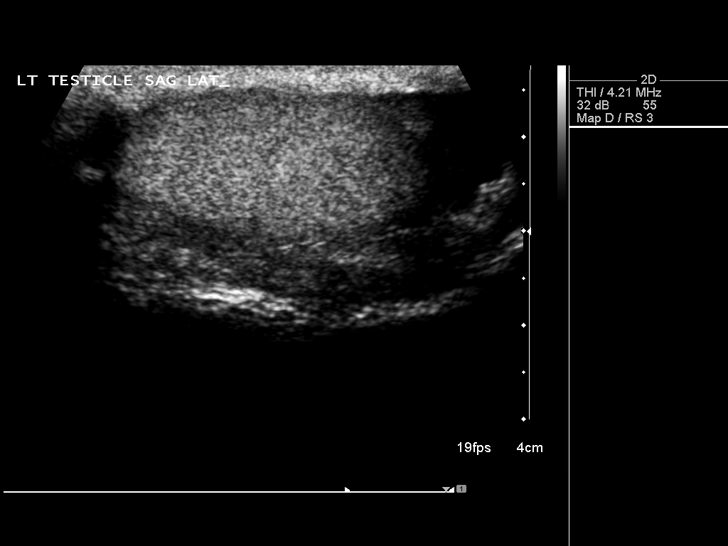
[im 28/45]
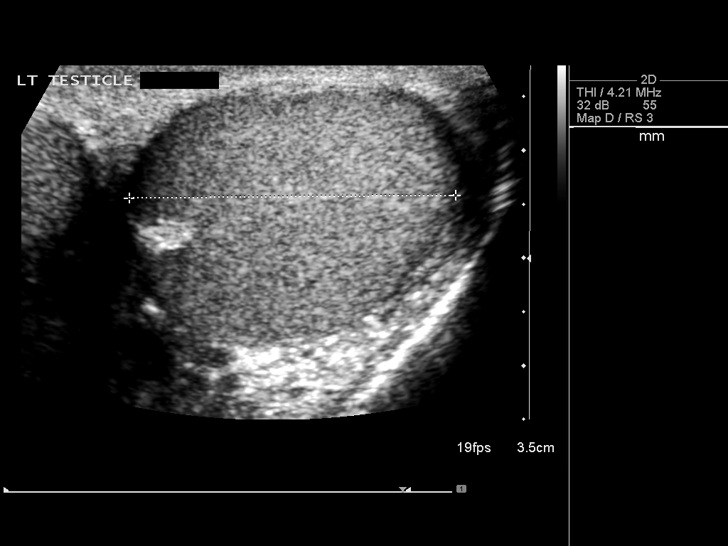
[im 30/45]
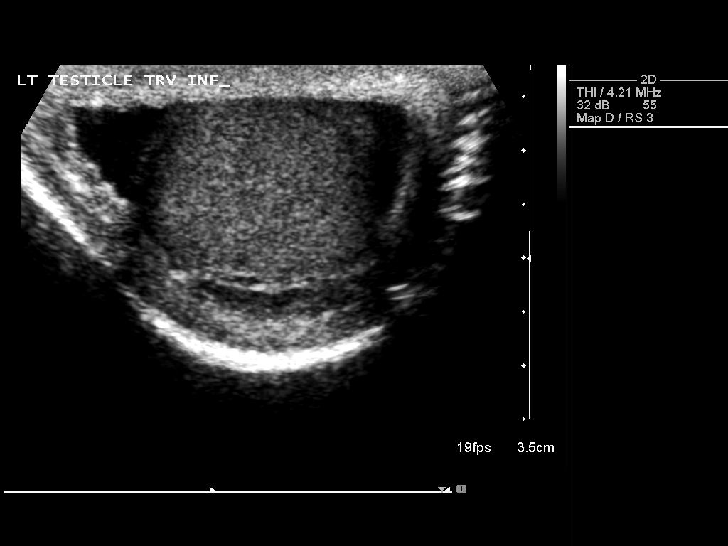
[im 34/45]
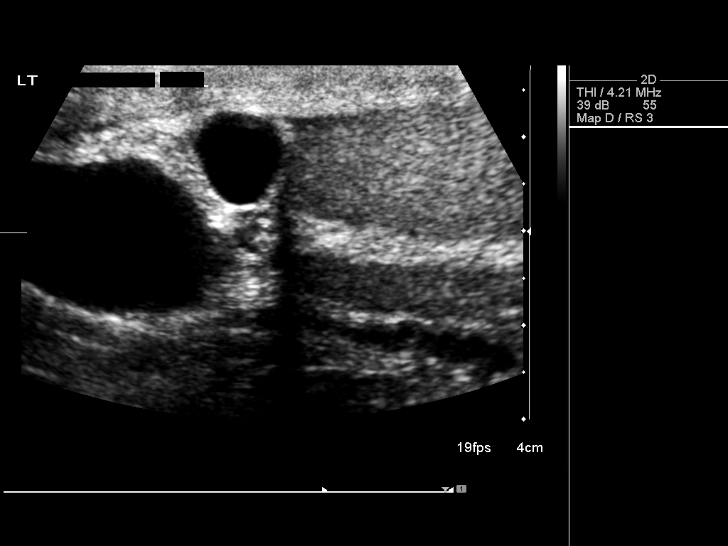
[im 37/45]
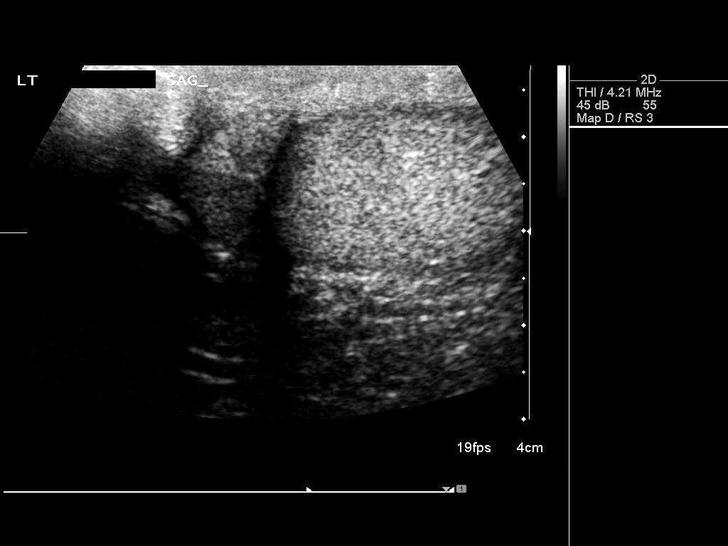
[im 41/45]
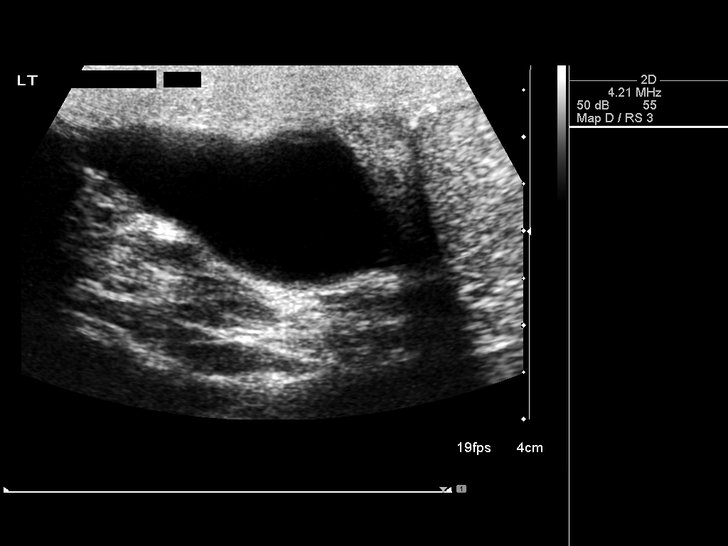
[im 45/45]
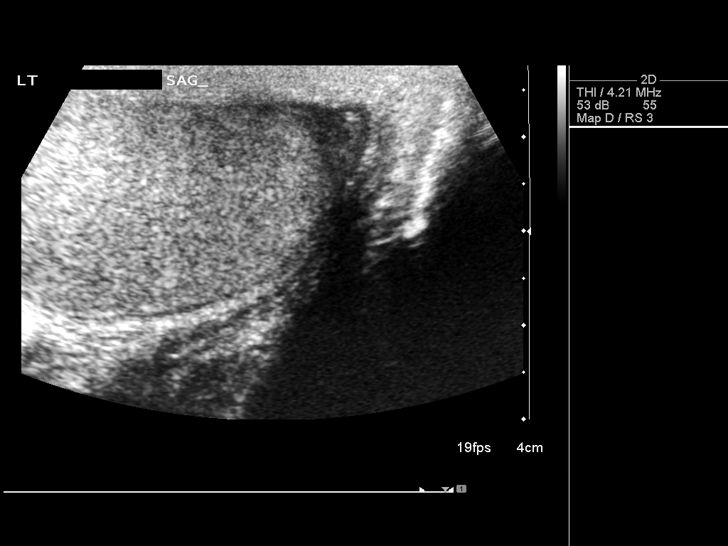

[14 of 25 positions shown; findings below may reference images not displayed]

FINDINGS: Right testicle

Measurements: 5.1 x 2.4 x 3.3 cm. Normal echogenicity without mass
or calcification. Internal blood flow present on color Doppler
imaging.

Left testicle

Measurements: 5.0 2.5 x 3.0 cm. Normal echogenicity without mass or
calcification. Internal blood flow present on color Doppler imaging.

Right epididymis:  Normal in size and appearance.

Left epididymis: 2 cysts at epididymal head, larger 3.4 x 1.8 x
cm and smaller 1.0 x 1.0 x 1.1 cm, question epididymal cysts versus
spermatoceles. Both cysts are simple in character.

Hydrocele:  Absent bilaterally

Varicocele:  Not assessed; Doppler exam not requested/not performed.
IMPRESSION: Two cysts at the LEFT epididymal head measuring 3.4 x 1.8 x 2.0 cm
and 1.0 x 1.0 x 1.1 cm, question epididymal cysts versus
spermatoceles.

## 2017-07-05 ENCOUNTER — Other Ambulatory Visit: Payer: Self-pay | Admitting: Internal Medicine

## 2017-07-05 DIAGNOSIS — E785 Hyperlipidemia, unspecified: Secondary | ICD-10-CM

## 2017-07-10 ENCOUNTER — Encounter: Payer: Self-pay | Admitting: Internal Medicine

## 2017-07-10 ENCOUNTER — Other Ambulatory Visit (INDEPENDENT_AMBULATORY_CARE_PROVIDER_SITE_OTHER): Payer: BLUE CROSS/BLUE SHIELD

## 2017-07-10 ENCOUNTER — Other Ambulatory Visit: Payer: Self-pay | Admitting: Internal Medicine

## 2017-07-10 DIAGNOSIS — N410 Acute prostatitis: Secondary | ICD-10-CM

## 2017-07-10 LAB — URINALYSIS, ROUTINE W REFLEX MICROSCOPIC
Ketones, ur: NEGATIVE
Leukocytes, UA: NEGATIVE
Nitrite: NEGATIVE
PH: 5.5 (ref 5.0–8.0)
TOTAL PROTEIN, URINE-UPE24: NEGATIVE
Urine Glucose: NEGATIVE
Urobilinogen, UA: 0.2 (ref 0.0–1.0)

## 2017-07-10 MED ORDER — SULFAMETHOXAZOLE-TRIMETHOPRIM 800-160 MG PO TABS: 1.0000 | ORAL_TABLET | Freq: Two times a day (BID) | ORAL | 0 refills | Status: DC

## 2017-07-14 ENCOUNTER — Other Ambulatory Visit (INDEPENDENT_AMBULATORY_CARE_PROVIDER_SITE_OTHER): Payer: BLUE CROSS/BLUE SHIELD

## 2017-07-14 ENCOUNTER — Other Ambulatory Visit: Payer: Self-pay | Admitting: Internal Medicine

## 2017-07-14 DIAGNOSIS — R3129 Other microscopic hematuria: Secondary | ICD-10-CM | POA: Insufficient documentation

## 2017-07-14 DIAGNOSIS — R300 Dysuria: Secondary | ICD-10-CM | POA: Insufficient documentation

## 2017-07-14 LAB — URINALYSIS, ROUTINE W REFLEX MICROSCOPIC
Bilirubin Urine: NEGATIVE
HGB URINE DIPSTICK: NEGATIVE
KETONES UR: NEGATIVE
LEUKOCYTES UA: NEGATIVE
Nitrite: NEGATIVE
Specific Gravity, Urine: 1.01 (ref 1.000–1.030)
Total Protein, Urine: NEGATIVE
URINE GLUCOSE: NEGATIVE
UROBILINOGEN UA: 0.2 (ref 0.0–1.0)
pH: 7 (ref 5.0–8.0)

## 2017-07-15 LAB — C. TRACHOMATIS/N. GONORRHOEAE RNA
C. TRACHOMATIS RNA, TMA: NOT DETECTED
N. GONORRHOEAE RNA, TMA: NOT DETECTED

## 2017-07-16 ENCOUNTER — Encounter: Payer: Self-pay | Admitting: Internal Medicine

## 2017-07-17 ENCOUNTER — Encounter: Payer: Self-pay | Admitting: Internal Medicine

## 2017-07-17 LAB — RPR: RPR Ser Ql: NONREACTIVE

## 2017-07-17 LAB — HIV ANTIBODY (ROUTINE TESTING W REFLEX): HIV: NONREACTIVE

## 2017-10-04 ENCOUNTER — Other Ambulatory Visit: Payer: Self-pay | Admitting: Internal Medicine

## 2017-10-04 DIAGNOSIS — E785 Hyperlipidemia, unspecified: Secondary | ICD-10-CM

## 2017-10-04 DIAGNOSIS — F32A Depression, unspecified: Secondary | ICD-10-CM

## 2017-10-04 DIAGNOSIS — F329 Major depressive disorder, single episode, unspecified: Secondary | ICD-10-CM

## 2017-10-04 NOTE — Telephone Encounter (Signed)
CPE scheduled on 7/10 with Dr Yetta BarreJones.

## 2017-10-04 NOTE — Telephone Encounter (Signed)
He will need an appt for refills. If he could schedule (even if it is his annual) - let me know.

## 2018-01-10 ENCOUNTER — Other Ambulatory Visit (INDEPENDENT_AMBULATORY_CARE_PROVIDER_SITE_OTHER): Payer: BLUE CROSS/BLUE SHIELD

## 2018-01-10 ENCOUNTER — Ambulatory Visit (INDEPENDENT_AMBULATORY_CARE_PROVIDER_SITE_OTHER): Payer: BLUE CROSS/BLUE SHIELD | Admitting: Internal Medicine

## 2018-01-10 ENCOUNTER — Encounter: Payer: Self-pay | Admitting: Internal Medicine

## 2018-01-10 VITALS — BP 136/70 | HR 60 | Temp 98.4°F | Resp 16 | Ht 66.0 in | Wt 210.0 lb

## 2018-01-10 DIAGNOSIS — Z0000 Encounter for general adult medical examination without abnormal findings: Secondary | ICD-10-CM

## 2018-01-10 DIAGNOSIS — N503 Cyst of epididymis: Secondary | ICD-10-CM

## 2018-01-10 DIAGNOSIS — L989 Disorder of the skin and subcutaneous tissue, unspecified: Secondary | ICD-10-CM

## 2018-01-10 LAB — LIPID PANEL
CHOLESTEROL: 159 mg/dL (ref 0–200)
HDL: 45.1 mg/dL (ref >39.00)
LDL CALC: 94 mg/dL (ref 0–99)
NONHDL: 113.95
Total CHOL/HDL Ratio: 4
Triglycerides: 100 mg/dL (ref 0.0–149.0)
VLDL: 20 mg/dL (ref 0.0–40.0)

## 2018-01-10 LAB — PSA: PSA: 0.83 ng/mL (ref 0.10–4.00)

## 2018-01-10 NOTE — Patient Instructions (Signed)

## 2018-01-10 NOTE — Progress Notes (Signed)
Subjective:  Patient ID: Ethan Crosby, male    DOB: 1966-03-21  Age: 52 y.o. MRN: [id number]  CC: Annual Exam   HPI FINCH COSTANZO presents for a CPX.   He complains of a warty lesion on the dorsum of his left foot that has been present for several months.  He says the area does not bother him very much.  He otherwise feels well and offers no complaints.  He is very active and denies any recent episodes of DOE, CP, palpitations, edema, or fatigue.  Outpatient Medications Prior to Visit  Medication Sig Dispense Refill  . aspirin EC 81 MG tablet Take 1 tablet (81 mg total) by mouth daily. 90 tablet 3  . Multiple Vitamin (MULTIVITAMIN) tablet Take 1 tablet by mouth daily.    . rosuvastatin (CRESTOR) 20 MG tablet Take 1 tablet (20 mg total) by mouth daily. 90 tablet 1  . venlafaxine XR (EFFEXOR-XR) 75 MG 24 hr capsule Take 1 capsule (75 mg total) by mouth daily with breakfast. 90 capsule 1  . sulfamethoxazole-trimethoprim (BACTRIM DS,SEPTRA DS) 800-160 MG tablet Take 1 tablet by mouth 2 (two) times daily. 60 tablet 0   No facility-administered medications prior to visit.     ROS Review of Systems  Constitutional: Negative for diaphoresis and fatigue.  HENT: Negative.  Negative for trouble swallowing.   Eyes: Negative.   Respiratory: Negative.  Negative for cough, chest tightness, shortness of breath and wheezing.   Cardiovascular: Negative for chest pain, palpitations and leg swelling.  Gastrointestinal: Negative for abdominal pain, constipation, diarrhea, nausea and vomiting.  Endocrine: Negative.   Genitourinary: Negative.  Negative for difficulty urinating, genital sores, penile pain, penile swelling, scrotal swelling, testicular pain and urgency.  Musculoskeletal: Negative.  Negative for arthralgias and myalgias.  Skin: Negative.   Allergic/Immunologic: Negative.   Neurological: Negative.  Negative for weakness.  Hematological: Negative.  Negative for adenopathy. Does not  bruise/bleed easily.  Psychiatric/Behavioral: Negative.  Negative for dysphoric mood and suicidal ideas. The patient is not nervous/anxious.     Objective:  BP 136/70 (BP Location: Left Arm, Patient Position: Sitting, Cuff Size: Large)   Pulse 60   Temp 98.4 F (36.9 C) (Oral)   Resp 16   Ht 5\' 6"  (1.676 m)   Wt 210 lb (95.3 kg)   SpO2 97%   BMI 33.89 kg/m   BP Readings from Last 3 Encounters:  01/10/18 136/70  01/03/17 128/78  12/21/16 120/80    Wt Readings from Last 3 Encounters:  01/10/18 210 lb (95.3 kg)  01/03/17 202 lb 8 oz (91.9 kg)  12/21/16 202 lb (91.6 kg)    Physical Exam  Constitutional: He is oriented to person, place, and time. No distress.  HENT:  Mouth/Throat: No oropharyngeal exudate.  Eyes: Conjunctivae are normal. No scleral icterus.  Neck: Normal range of motion. Neck supple. No JVD present. No thyromegaly present.  Cardiovascular: Normal rate, regular rhythm and normal heart sounds. Exam reveals no gallop.  No murmur heard. Pulmonary/Chest: Effort normal and breath sounds normal. No respiratory distress. He has no wheezes. He has no rales.  Abdominal: Soft. Bowel sounds are normal. He exhibits no mass. There is no hepatosplenomegaly. There is no tenderness. No hernia. Hernia confirmed negative in the right inguinal area and confirmed negative in the left inguinal area.  Genitourinary: Rectum normal, prostate normal and penis normal. Rectal exam shows no external hemorrhoid, no internal hemorrhoid, no fissure, no mass, no tenderness, anal tone normal and guaiac  negative stool. Prostate is not enlarged and not tender. Right testis shows no mass, no swelling and no tenderness. Right testis is descended. Left testis shows mass. Left testis shows no swelling and no tenderness. Left testis is descended. Circumcised. No penile erythema or penile tenderness. No discharge found.  Genitourinary Comments: There is a soft, squishy, nontender cystic lesion in the left  epididymis.  Musculoskeletal: Normal range of motion. He exhibits no edema, tenderness or deformity.       Feet:  Lymphadenopathy:    He has no cervical adenopathy. No inguinal adenopathy noted on the right or left side.  Neurological: He is alert and oriented to person, place, and time.  Skin: Skin is warm and dry. No rash noted. He is not diaphoretic.  Psychiatric: He has a normal mood and affect. His behavior is normal. Judgment and thought content normal.  Vitals reviewed.   Lab Results  Component Value Date   WBC 8.1 01/03/2017   HGB 15.9 01/03/2017   HCT 46.1 01/03/2017   PLT 194.0 01/03/2017   GLUCOSE 89 01/03/2017   CHOL 159 01/10/2018   TRIG 100.0 01/10/2018   HDL 45.10 01/10/2018   LDLDIRECT 236.0 12/30/2015   LDLCALC 94 01/10/2018   ALT 27 01/03/2017   AST 24 01/03/2017   NA 138 01/03/2017   K 4.6 01/03/2017   CL 107 01/03/2017   CREATININE 0.86 01/03/2017   BUN 12 01/03/2017   CO2 25 01/03/2017   TSH 1.04 02/03/2016   PSA 0.83 01/10/2018   HGBA1C 5.3 12/30/2015    Ct Abdomen Pelvis W Contrast  Result Date: 06/02/2016 CLINICAL DATA:  Right upper quadrant abdominal pain and tenderness for 2 weeks. Elevated white blood cell count. EXAM: CT ABDOMEN AND PELVIS WITH CONTRAST TECHNIQUE: Multidetector CT imaging of the abdomen and pelvis was performed using the standard protocol following bolus administration of intravenous contrast. CONTRAST:  100mL ISOVUE-300 IOPAMIDOL (ISOVUE-300) INJECTION 61% COMPARISON:  None. FINDINGS: Lower chest: Clear lung bases.  Heart normal in size. Hepatobiliary: No focal liver abnormality is seen. No gallstones, gallbladder wall thickening, or biliary dilatation. Gallbladder is mostly decompressed. Pancreas: Unremarkable. No pancreatic ductal dilatation or surrounding inflammatory changes. Spleen: Normal in size without focal abnormality. Adrenals/Urinary Tract: No adrenal masses. 5.9 cm lower pole right renal cyst. No other renal masses.  Small nonobstructing stone in the midpole the right kidney. No other intrarenal stones. No hydronephrosis. Normal ureters. Normal bladder. Stomach/Bowel: Stomach is within normal limits. Appendix appears normal. No evidence of bowel wall thickening, distention, or inflammatory changes. Vascular/Lymphatic: No significant vascular findings are present. No enlarged abdominal or pelvic lymph nodes. Reproductive: Unremarkable Other: No abdominal wall hernia or abnormality. No abdominopelvic ascites. Musculoskeletal: Status post interbody fusion at L4-L5. No fracture or acute bony abnormality. No osteoblastic or osteolytic lesions. IMPRESSION: 1. No acute findings. No findings to account for right upper quadrant abdominal pain or elevated white blood cell count. Normal appendix visualized. Electronically Signed   By: Amie Portlandavid  Ormond M.D.   On: 06/02/2016 17:06    Assessment & Plan:   [name] was seen today for annual exam.  Diagnoses and all orders for this visit:  Routine general medical examination at a health care facility-exam completed, vaccines reviewed, labs reviewed, colon cancer screening is up-to-date, patient education material was given. -     Lipid panel; Future -     PSA; Future -     HIV antibody; Future  Lesion of skin of foot- This appears to be a  verrucous lesion.  It was treated with cryotherapy.  He will return in about 2 weeks and if the lesion has not resolved then I may consider an excisional biopsy to rule out skin cancer.  Epididymal cyst- This has been stable over the last 2 or 3 years and does not cause him discomfort.  It is benign and does not require any intervention at this time.   I have discontinued Allayne Gitelman. Herder's sulfamethoxazole-trimethoprim. I am also having him maintain his aspirin EC, multivitamin, venlafaxine XR, and rosuvastatin.  No orders of the defined types were placed in this encounter.    Follow-up: Return if symptoms worsen or fail to  improve.  Sanda Linger, MD

## 2018-01-11 ENCOUNTER — Encounter: Payer: Self-pay | Admitting: Internal Medicine

## 2018-01-11 LAB — HIV ANTIBODY (ROUTINE TESTING W REFLEX): HIV 1&2 Ab, 4th Generation: NONREACTIVE

## 2018-01-12 DIAGNOSIS — L989 Disorder of the skin and subcutaneous tissue, unspecified: Secondary | ICD-10-CM | POA: Insufficient documentation

## 2018-03-06 ENCOUNTER — Other Ambulatory Visit: Payer: Self-pay | Admitting: Internal Medicine

## 2018-03-06 DIAGNOSIS — L600 Ingrowing nail: Secondary | ICD-10-CM | POA: Insufficient documentation

## 2018-03-06 MED ORDER — AMOXICILLIN-POT CLAVULANATE 875-125 MG PO TABS: 1.0000 | ORAL_TABLET | Freq: Two times a day (BID) | ORAL | 0 refills | Status: AC

## 2018-04-03 ENCOUNTER — Other Ambulatory Visit: Payer: Self-pay | Admitting: Internal Medicine

## 2018-04-03 DIAGNOSIS — F329 Major depressive disorder, single episode, unspecified: Secondary | ICD-10-CM

## 2018-04-03 DIAGNOSIS — E785 Hyperlipidemia, unspecified: Secondary | ICD-10-CM

## 2018-04-03 DIAGNOSIS — F32A Depression, unspecified: Secondary | ICD-10-CM

## 2018-07-08 ENCOUNTER — Other Ambulatory Visit: Payer: Self-pay | Admitting: Internal Medicine

## 2018-07-08 DIAGNOSIS — N451 Epididymitis: Secondary | ICD-10-CM

## 2018-07-08 MED ORDER — AMOXICILLIN-POT CLAVULANATE 875-125 MG PO TABS: 1.0000 | ORAL_TABLET | Freq: Two times a day (BID) | ORAL | 0 refills | Status: AC

## 2018-08-07 ENCOUNTER — Other Ambulatory Visit: Payer: Self-pay | Admitting: Internal Medicine

## 2018-08-07 DIAGNOSIS — M546 Pain in thoracic spine: Secondary | ICD-10-CM | POA: Insufficient documentation

## 2018-08-07 MED ORDER — TIZANIDINE HCL 2 MG PO TABS: 2.0000 mg | ORAL_TABLET | Freq: Four times a day (QID) | ORAL | 0 refills | Status: DC | PRN

## 2018-09-29 ENCOUNTER — Other Ambulatory Visit: Payer: Self-pay | Admitting: Internal Medicine

## 2018-09-29 DIAGNOSIS — F329 Major depressive disorder, single episode, unspecified: Secondary | ICD-10-CM

## 2018-09-29 DIAGNOSIS — F32A Depression, unspecified: Secondary | ICD-10-CM

## 2018-09-29 DIAGNOSIS — E785 Hyperlipidemia, unspecified: Secondary | ICD-10-CM

## 2019-03-09 ENCOUNTER — Ambulatory Visit (INDEPENDENT_AMBULATORY_CARE_PROVIDER_SITE_OTHER): Payer: BLUE CROSS/BLUE SHIELD

## 2019-03-09 ENCOUNTER — Other Ambulatory Visit: Payer: Self-pay

## 2019-03-09 DIAGNOSIS — Z23 Encounter for immunization: Secondary | ICD-10-CM | POA: Diagnosis not present

## 2019-04-09 ENCOUNTER — Other Ambulatory Visit: Payer: Self-pay | Admitting: Internal Medicine

## 2019-04-09 DIAGNOSIS — F329 Major depressive disorder, single episode, unspecified: Secondary | ICD-10-CM

## 2019-04-09 DIAGNOSIS — F32A Depression, unspecified: Secondary | ICD-10-CM

## 2019-06-21 ENCOUNTER — Other Ambulatory Visit: Payer: Self-pay | Admitting: Internal Medicine

## 2019-06-21 DIAGNOSIS — E785 Hyperlipidemia, unspecified: Secondary | ICD-10-CM

## 2019-10-13 ENCOUNTER — Encounter: Payer: Self-pay | Admitting: Internal Medicine

## 2019-10-14 ENCOUNTER — Other Ambulatory Visit: Payer: Self-pay | Admitting: Internal Medicine

## 2019-10-14 DIAGNOSIS — F329 Major depressive disorder, single episode, unspecified: Secondary | ICD-10-CM

## 2019-10-14 DIAGNOSIS — F32A Depression, unspecified: Secondary | ICD-10-CM

## 2019-10-14 DIAGNOSIS — E785 Hyperlipidemia, unspecified: Secondary | ICD-10-CM

## 2019-10-14 MED ORDER — ROSUVASTATIN CALCIUM 20 MG PO TABS: 20.0000 mg | ORAL_TABLET | Freq: Every day | ORAL | 0 refills | Status: DC

## 2019-10-14 MED ORDER — VENLAFAXINE HCL ER 75 MG PO CP24: 75.0000 mg | ORAL_CAPSULE | Freq: Every day | ORAL | 0 refills | Status: DC

## 2019-10-17 ENCOUNTER — Encounter: Payer: BLUE CROSS/BLUE SHIELD | Admitting: Internal Medicine

## 2019-10-23 ENCOUNTER — Other Ambulatory Visit: Payer: Self-pay

## 2019-10-23 ENCOUNTER — Ambulatory Visit (INDEPENDENT_AMBULATORY_CARE_PROVIDER_SITE_OTHER): Payer: BC Managed Care – PPO | Admitting: Internal Medicine

## 2019-10-23 ENCOUNTER — Encounter: Payer: Self-pay | Admitting: Internal Medicine

## 2019-10-23 VITALS — BP 124/78 | HR 79 | Temp 98.2°F | Ht 66.0 in | Wt 219.0 lb

## 2019-10-23 DIAGNOSIS — F329 Major depressive disorder, single episode, unspecified: Secondary | ICD-10-CM | POA: Diagnosis not present

## 2019-10-23 DIAGNOSIS — F32A Depression, unspecified: Secondary | ICD-10-CM

## 2019-10-23 DIAGNOSIS — E785 Hyperlipidemia, unspecified: Secondary | ICD-10-CM | POA: Diagnosis not present

## 2019-10-23 DIAGNOSIS — M6702 Short Achilles tendon (acquired), left ankle: Secondary | ICD-10-CM | POA: Diagnosis not present

## 2019-10-23 DIAGNOSIS — Z0000 Encounter for general adult medical examination without abnormal findings: Secondary | ICD-10-CM | POA: Diagnosis not present

## 2019-10-23 LAB — BASIC METABOLIC PANEL
BUN: 11 mg/dL (ref 6–23)
CO2: 23 mEq/L (ref 19–32)
Calcium: 9.4 mg/dL (ref 8.4–10.5)
Chloride: 104 mEq/L (ref 96–112)
Creatinine, Ser: 0.78 mg/dL (ref 0.40–1.50)
GFR: 103.92 mL/min (ref >60.00)
Glucose, Bld: 97 mg/dL (ref 70–99)
Potassium: 5.9 mEq/L — ABNORMAL HIGH (ref 3.5–5.1)
Sodium: 138 mEq/L (ref 135–145)

## 2019-10-23 LAB — CBC WITH DIFFERENTIAL/PLATELET
Basophils Absolute: 0.1 10*3/uL (ref 0.0–0.1)
Basophils Relative: 1.3 % (ref 0.0–3.0)
Eosinophils Absolute: 1.1 10*3/uL — ABNORMAL HIGH (ref 0.0–0.7)
Eosinophils Relative: 11.8 % — ABNORMAL HIGH (ref 0.0–5.0)
HCT: 48.7 % (ref 39.0–52.0)
Hemoglobin: 16.5 g/dL (ref 13.0–17.0)
Lymphocytes Relative: 30 % (ref 12.0–46.0)
Lymphs Abs: 2.7 10*3/uL (ref 0.7–4.0)
MCHC: 33.8 g/dL (ref 30.0–36.0)
MCV: 90.2 fl (ref 78.0–100.0)
Monocytes Absolute: 1 10*3/uL (ref 0.1–1.0)
Monocytes Relative: 10.8 % (ref 3.0–12.0)
Neutro Abs: 4.2 10*3/uL (ref 1.4–7.7)
Neutrophils Relative %: 46.1 % (ref 43.0–77.0)
Platelets: 136 10*3/uL — ABNORMAL LOW (ref 150.0–400.0)
RBC: 5.4 Mil/uL (ref 4.22–5.81)
RDW: 14.1 % (ref 11.5–15.5)
WBC: 9.1 10*3/uL (ref 4.0–10.5)

## 2019-10-23 LAB — LIPID PANEL
Cholesterol: 169 mg/dL (ref 0–200)
HDL: 47 mg/dL (ref >39.00)
LDL Cholesterol: 112 mg/dL — ABNORMAL HIGH (ref 0–99)
NonHDL: 122.35
Total CHOL/HDL Ratio: 4
Triglycerides: 50 mg/dL (ref 0.0–149.0)
VLDL: 10 mg/dL (ref 0.0–40.0)

## 2019-10-23 LAB — TSH: TSH: 1.15 u[IU]/mL (ref 0.35–4.50)

## 2019-10-23 LAB — HEPATIC FUNCTION PANEL
ALT: 22 U/L (ref 0–53)
AST: 27 U/L (ref 0–37)
Albumin: 4.8 g/dL (ref 3.5–5.2)
Alkaline Phosphatase: 66 U/L (ref 39–117)
Bilirubin, Direct: 0.1 mg/dL (ref 0.0–0.3)
Total Bilirubin: 0.4 mg/dL (ref 0.2–1.2)
Total Protein: 7.2 g/dL (ref 6.0–8.3)

## 2019-10-23 LAB — PSA: PSA: 0.87 ng/mL (ref 0.10–4.00)

## 2019-10-23 NOTE — Patient Instructions (Signed)

## 2019-10-23 NOTE — Progress Notes (Signed)
Subjective:  Patient ID: Ethan Crosby, male    DOB: 1966-05-20  Age: 54 y.o. MRN: [id number]  CC: Annual Exam, Hyperlipidemia, and Depression  This visit occurred during the SARS-CoV-2 public health emergency.  Safety protocols were in place, including screening questions prior to the visit, additional usage of staff PPE, and extensive cleaning of exam room while observing appropriate contact time as indicated for disinfecting solutions.    HPI Ethan Crosby presents for a CPX.  About 7 months ago he was at the beach walking with his dogs.  The dogs were not on a leash and got away from him.  He started running awkwardly to try to catch them and in the process of running, and it sounds like it was quite a good distance, he injured his lower extremities below the knees.  He has treated this with willful neglect.  Over the ensuing months he has developed lower extremity pain and difficulty with his gait.  He feels like he has limited mobility in his ankles and feet.  He has not seen anyone about this.  He uses a push lawnmower and does not experience chest pain or shortness of breath on an incline.  He complains of weight gain but denies any recent episodes of diaphoresis, dizziness, or lightheadedness.  Outpatient Medications Prior to Visit  Medication Sig Dispense Refill  . Multiple Vitamin (MULTIVITAMIN) tablet Take 1 tablet by mouth daily.    . rosuvastatin (CRESTOR) 20 MG tablet Take 1 tablet (20 mg total) by mouth daily. 90 tablet 0  . venlafaxine XR (EFFEXOR-XR) 75 MG 24 hr capsule Take 1 capsule (75 mg total) by mouth daily with breakfast. 90 capsule 0  . aspirin EC 81 MG tablet Take 1 tablet (81 mg total) by mouth daily. (Patient not taking: Reported on 10/23/2019) 90 tablet 3   No facility-administered medications prior to visit.    ROS Review of Systems  Constitutional: Positive for unexpected weight change. Negative for appetite change, diaphoresis and fatigue.  HENT:  Negative.   Eyes: Negative.   Respiratory: Negative for cough, chest tightness, shortness of breath and wheezing.   Cardiovascular: Negative for chest pain, palpitations and leg swelling.  Gastrointestinal: Negative for abdominal pain, blood in stool, constipation, diarrhea, nausea and vomiting.  Endocrine: Negative.   Genitourinary: Negative.  Negative for difficulty urinating, hematuria, penile swelling, scrotal swelling and testicular pain.  Musculoskeletal: Positive for arthralgias and gait problem. Negative for myalgias and neck pain.  Skin: Negative.   Neurological: Negative for dizziness, weakness and light-headedness.  Hematological: Negative for adenopathy. Does not bruise/bleed easily.  Psychiatric/Behavioral: Negative.  Negative for dysphoric mood, sleep disturbance and suicidal ideas. The patient is not nervous/anxious.     Objective:  BP 124/78 (BP Location: Left Arm, Patient Position: Sitting, Cuff Size: Large)   Pulse 79   Temp 98.2 F (36.8 C) (Oral)   Ht 5\' 6"  (1.676 m)   Wt 219 lb (99.3 kg)   SpO2 98%   BMI 35.35 kg/m   BP Readings from Last 3 Encounters:  10/23/19 124/78  01/10/18 136/70  01/03/17 128/78    Wt Readings from Last 3 Encounters:  10/23/19 219 lb (99.3 kg)  01/10/18 210 lb (95.3 kg)  01/03/17 202 lb 8 oz (91.9 kg)    Physical Exam Vitals reviewed. Exam conducted with a chaperone present.  Constitutional:      Appearance: Normal appearance.  HENT:     Nose: Nose normal.     Mouth/Throat:  Mouth: Mucous membranes are moist.  Eyes:     General: No scleral icterus.    Conjunctiva/sclera: Conjunctivae normal.  Cardiovascular:     Rate and Rhythm: Normal rate and regular rhythm.  Pulmonary:     Effort: Pulmonary effort is normal.     Breath sounds: No stridor. No wheezing or rales.  Abdominal:     General: Abdomen is protuberant. Bowel sounds are normal.     Palpations: There is no hepatomegaly, splenomegaly or mass.     Tenderness:  There is no abdominal tenderness.  Genitourinary:    Pubic Area: No rash.      Penis: Circumcised. No discharge, swelling or lesions.      Testes:        Right: Varicocele present. Mass, tenderness, swelling or testicular hydrocele not present.        Left: Mass, tenderness, swelling, testicular hydrocele or varicocele not present.     Epididymis:     Right: Normal. Not inflamed or enlarged.     Left: Not inflamed or enlarged.     Prostate: Normal. Not enlarged, not tender and no nodules present.     Rectum: Normal. Guaiac result negative. No mass, tenderness, anal fissure, external hemorrhoid or internal hemorrhoid. Normal anal tone.  Musculoskeletal:        General: Deformity present. No swelling. Normal range of motion.     Cervical back: Neck supple.     Comments: Left Achilles is contracted and has decreased mobillity.  Right Achilles is tender and feels smaller than the left Achilles.  Lymphadenopathy:     Cervical: No cervical adenopathy.  Skin:    General: Skin is warm and dry.  Neurological:     General: No focal deficit present.     Mental Status: He is alert and oriented to person, place, and time. Mental status is at baseline.  Psychiatric:        Mood and Affect: Mood normal.        Behavior: Behavior normal.        Thought Content: Thought content normal.        Judgment: Judgment normal.     Lab Results  Component Value Date   WBC 9.1 10/23/2019   HGB 16.5 10/23/2019   HCT 48.7 10/23/2019   PLT 136.0 (L) 10/23/2019   GLUCOSE 97 10/23/2019   CHOL 169 10/23/2019   TRIG 50.0 10/23/2019   HDL 47.00 10/23/2019   LDLDIRECT 236.0 12/30/2015   LDLCALC 112 (H) 10/23/2019   ALT 22 10/23/2019   AST 27 10/23/2019   NA 138 10/23/2019   K 5.9 Hemolysis seen.. (H) 10/23/2019   CL 104 10/23/2019   CREATININE 0.78 10/23/2019   BUN 11 10/23/2019   CO2 23 10/23/2019   TSH 1.15 10/23/2019   PSA 0.87 10/23/2019   HGBA1C 5.3 12/30/2015    CT ABDOMEN PELVIS W  CONTRAST  Result Date: 06/02/2016 CLINICAL DATA:  Right upper quadrant abdominal pain and tenderness for 2 weeks. Elevated white blood cell count. EXAM: CT ABDOMEN AND PELVIS WITH CONTRAST TECHNIQUE: Multidetector CT imaging of the abdomen and pelvis was performed using the standard protocol following bolus administration of intravenous contrast. CONTRAST:  ISOVUE-300 IOPAMIDOL (ISOVUE-300) INJECTION 61% COMPARISON:  None. FINDINGS: Lower chest: Clear lung bases.  Heart normal in size. Hepatobiliary: No focal liver abnormality is seen. No gallstones, gallbladder wall thickening, or biliary dilatation. Gallbladder is mostly decompressed. Pancreas: Unremarkable. No pancreatic ductal dilatation or surrounding inflammatory changes. Spleen: Normal in  size without focal abnormality. Adrenals/Urinary Tract: No adrenal masses. 5.9 cm lower pole right renal cyst. No other renal masses. Small nonobstructing stone in the midpole the right kidney. No other intrarenal stones. No hydronephrosis. Normal ureters. Normal bladder. Stomach/Bowel: Stomach is within normal limits. Appendix appears normal. No evidence of bowel wall thickening, distention, or inflammatory changes. Vascular/Lymphatic: No significant vascular findings are present. No enlarged abdominal or pelvic lymph nodes. Reproductive: Unremarkable Other: No abdominal wall hernia or abnormality. No abdominopelvic ascites. Musculoskeletal: Status post interbody fusion at L4-L5. No fracture or acute bony abnormality. No osteoblastic or osteolytic lesions. IMPRESSION: 1. No acute findings. No findings to account for right upper quadrant abdominal pain or elevated white blood cell count. Normal appendix visualized. Electronically Signed   By: Amie Portland M.D.   On: 06/02/2016 17:06    Assessment & Plan:   Shaya was seen today for annual exam, hyperlipidemia and depression.  Diagnoses and all orders for this visit:  Routine general medical examination at a  health care facility- Exam completed, labs reviewed, vaccines reviewed and updated, vaccines are up-to-date, colon cancer screening is up-to-date, patient education was given. -     Lipid panel; Future -     PSA; Future -     PSA -     Lipid panel  Hyperlipidemia with target LDL less than 100- He has achieved his LDL goal and is doing well on the statin. -     CBC with Differential/Platelet; Future -     Basic metabolic panel; Future -     Hepatic function panel; Future -     TSH; Future -     TSH -     Hepatic function panel -     Basic metabolic panel -     CBC with Differential/Platelet  Depression, controlled- He is doing well on venlafaxine.  Will continue the current dose. -     CBC with Differential/Platelet; Future -     Basic metabolic panel; Future -     Hepatic function panel; Future -     TSH; Future -     TSH -     Hepatic function panel -     Basic metabolic panel -     CBC with Differential/Platelet  Achilles tendon contracture, left -     Ambulatory referral to Sports Medicine   I am having Ethan Crosby. Sandate maintain his aspirin EC, multivitamin, venlafaxine XR, and rosuvastatin.  No orders of the defined types were placed in this encounter.    Follow-up: Return in about 6 months (around 04/23/2020).  Sanda Linger, MD

## 2019-10-24 ENCOUNTER — Encounter: Payer: Self-pay | Admitting: Internal Medicine

## 2019-10-31 ENCOUNTER — Ambulatory Visit: Payer: Self-pay

## 2019-10-31 ENCOUNTER — Encounter: Payer: Self-pay | Admitting: Family Medicine

## 2019-10-31 ENCOUNTER — Ambulatory Visit (INDEPENDENT_AMBULATORY_CARE_PROVIDER_SITE_OTHER): Payer: BC Managed Care – PPO | Admitting: Family Medicine

## 2019-10-31 ENCOUNTER — Other Ambulatory Visit: Payer: Self-pay

## 2019-10-31 VITALS — BP 134/90 | HR 127 | Ht 66.0 in | Wt 217.0 lb

## 2019-10-31 DIAGNOSIS — S86019A Strain of unspecified Achilles tendon, initial encounter: Secondary | ICD-10-CM

## 2019-10-31 DIAGNOSIS — M6701 Short Achilles tendon (acquired), right ankle: Secondary | ICD-10-CM

## 2019-10-31 DIAGNOSIS — M6702 Short Achilles tendon (acquired), left ankle: Secondary | ICD-10-CM

## 2019-10-31 NOTE — Patient Instructions (Addendum)
Thank you for coming in today.  I think you had bilateral achillis tendon rupture.  Right is worse than left.  Plan for PT and ortho referral.  Keep me updated.  I am happy to help guide you through this.

## 2019-10-31 NOTE — Progress Notes (Signed)
Subjective:    I'm seeing this patient as a consultation for:  Dr Yetta Barre. Note will be routed back to referring provider/PCP.  CC: L Achille's pain  I, Debbe Odea, am serving as a scribe for Dr. Clementeen Graham.  HPI: Pt is a 54 y/o male presenting w/ c/o L Achille's pain x approximately 7 months after injuring himself when he ran after his dogs on the beach this past summer.  He rates his pain as 2-3/10 and describes his pain as pulling or tightness. Patient did wear a boot for about 3 months. Patient states that he is not really feeling pain but it is balance that he is having problems with. Cannot put on pants falls over unable to go up on toes.  His goals are to be more stable when he walks and being able to balance while standing on 1 foot to put his pants on.  He is not sure that he wants surgery.  Radiating pain: no  Swelling: no  Aggravating factors: trying to go on toes or walking too much Treatments tried: ice wore boot   Past medical history, Surgical history, Family history, Social history, Allergies, and medications have been entered into the medical record, reviewed.   Review of Systems: No new headache, visual changes, nausea, vomiting, diarrhea, constipation, dizziness, abdominal pain, skin rash, fevers, chills, night sweats, weight loss, swollen lymph nodes, body aches, joint swelling, muscle aches, chest pain, shortness of breath, mood changes, visual or auditory hallucinations.   Objective:    Vitals:   10/31/19 0849  BP: 134/90  Pulse: (!) 127  SpO2: 95%   General: Well Developed, well nourished, and in no acute distress.  Neuro/Psych: Alert and oriented x3, extra-ocular muscles intact, able to move all 4 extremities, sensation grossly intact. Skin: Warm and dry, no rashes noted.  Respiratory: Not using accessory muscles, speaking in full sentences, trachea midline.  Cardiovascular: Pulses palpable, no extremity edema. Abdomen: Does not appear distended. MSK:    Left calf normal-appearing without significant swelling or bruising. Palpable defect at distal Achilles tendon. Nontender. Normal foot motion however significant weakness to resisted foot plantarflexion. Pulses capillary fill and sensation are intact distally.  Right calf: Normal-appearing without significant swelling or bruising. Palpable defect distal Achilles tendon. Nontender. Normal foot motion or significant weakness to resisted foot plantarflexion.  Lab and Radiology Results  Diagnostic Limited MSK Ultrasound of: Left Achilles tendon Rupture of Achilles tendon approximately 3 cm proximal to insertion of calcaneus.  There appears to be a partial connection on transverse imaging.  Does not appear to be full-thickness rupture. Impression: Near complete rupture left Achilles tendon  Diagnostic Limited MSK Ultrasound of: Right Achilles tendon Complete rupture Achilles tendon approximately 3 humerus proximal to insertion on calcaneus. Rupture appears to be full-thickness with proximal tendon edge about 4 cm retracted. Impression: Complete rupture right Achilles tendon        Impression and Recommendations:    Assessment and Plan: 54 y.o. male with bilateral Achilles tendon tears occurring about 7 months ago.  At this point patient has done quite a bit of on his own conservative management.  He describes initially using a cam walker boot.  At this point he still has some functional limitations.  He has considerable weakness to foot plantarflexion.  In his day-to-day life he is having some difficulty with balancing but overall is pretty functional.  He is interested in trying physical therapy.  I think this is pretty reasonable but I think it  is worthwhile having a conversation with orthopedic surgery to discuss his surgical options. Plan for referral to orthopedic surgery for surgical consultation.  Additionally will place a referral to physical therapy however recommend that he  have discussion with orthopedic surgery prior to physical therapy starting. Recheck back with me as needed.  Happy to answer questions..  Additionally discussed the possibility of custom ankle-foot orthosis.  This would probably help with balance and stability.  After describing the orthosis he thinks that would be more annoying than helpful which I am inclined to agree with.  That is an option in the future if needed.   Orders Placed This Encounter  Procedures  . [place] LIMITED JOINT SPACE STRUCTURES LOW RIGHT    Standing Status:   Future    Number of Occurrences:   1    Standing Expiration Date:   12/30/2020    Order Specific Question:   Reason for Exam (SYMPTOM  OR DIAGNOSIS REQUIRED)    Answer:   achilles tendon pain    Order Specific Question:   Preferred imaging location?    Answer:   [place]  . Ambulatory referral to Orthopedic Surgery    Referral Priority:   Routine    Referral Type:   Surgical    Referral Reason:   Specialty Services Required    Requested Specialty:   Orthopedic Surgery    Number of Visits Requested:   1  . Ambulatory referral to Physical Therapy    Referral Priority:   Routine    Referral Type:   Physical Medicine    Referral Reason:   Specialty Services Required    Requested Specialty:   Physical Therapy   No orders of the defined types were placed in this encounter.   Discussed warning signs or symptoms. Please see discharge instructions. Patient expresses understanding.   The above documentation has been reviewed and is accurate and complete Lynne Leader

## 2019-11-18 ENCOUNTER — Other Ambulatory Visit: Payer: Self-pay

## 2019-11-18 ENCOUNTER — Ambulatory Visit: Payer: BC Managed Care – PPO

## 2019-12-09 ENCOUNTER — Other Ambulatory Visit: Payer: Self-pay | Admitting: Internal Medicine

## 2019-12-09 DIAGNOSIS — N451 Epididymitis: Secondary | ICD-10-CM | POA: Insufficient documentation

## 2019-12-09 MED ORDER — AMOXICILLIN-POT CLAVULANATE 875-125 MG PO TABS: 1.0000 | ORAL_TABLET | Freq: Two times a day (BID) | ORAL | 0 refills | Status: AC

## 2020-01-07 ENCOUNTER — Ambulatory Visit: Payer: BC Managed Care – PPO | Admitting: Internal Medicine

## 2020-01-08 ENCOUNTER — Encounter: Payer: Self-pay | Admitting: Internal Medicine

## 2020-01-08 ENCOUNTER — Ambulatory Visit (INDEPENDENT_AMBULATORY_CARE_PROVIDER_SITE_OTHER): Payer: BC Managed Care – PPO

## 2020-01-08 ENCOUNTER — Other Ambulatory Visit: Payer: Self-pay

## 2020-01-08 ENCOUNTER — Ambulatory Visit: Payer: BC Managed Care – PPO | Admitting: Internal Medicine

## 2020-01-08 ENCOUNTER — Ambulatory Visit: Payer: BC Managed Care – PPO

## 2020-01-08 VITALS — BP 142/82 | HR 76 | Temp 98.3°F | Resp 16 | Ht 66.0 in | Wt 220.0 lb

## 2020-01-08 DIAGNOSIS — N4341 Spermatocele of epididymis, single: Secondary | ICD-10-CM | POA: Insufficient documentation

## 2020-01-08 DIAGNOSIS — R10814 Left lower quadrant abdominal tenderness: Secondary | ICD-10-CM | POA: Diagnosis not present

## 2020-01-08 DIAGNOSIS — M25552 Pain in left hip: Secondary | ICD-10-CM | POA: Insufficient documentation

## 2020-01-08 DIAGNOSIS — N4340 Spermatocele of epididymis, unspecified: Secondary | ICD-10-CM | POA: Insufficient documentation

## 2020-01-08 DIAGNOSIS — D696 Thrombocytopenia, unspecified: Secondary | ICD-10-CM | POA: Diagnosis not present

## 2020-01-08 DIAGNOSIS — R3129 Other microscopic hematuria: Secondary | ICD-10-CM

## 2020-01-08 LAB — POC URINALSYSI DIPSTICK (AUTOMATED)
Bilirubin, UA: NEGATIVE
Blood, UA: POSITIVE
Glucose, UA: NEGATIVE
Leukocytes, UA: NEGATIVE
Nitrite, UA: NEGATIVE
Protein, UA: NEGATIVE
Spec Grav, UA: 1.02 (ref 1.010–1.025)
Urobilinogen, UA: 0.2 E.U./dL
pH, UA: 6 (ref 5.0–8.0)

## 2020-01-08 LAB — CBC WITH DIFFERENTIAL/PLATELET
Basophils Absolute: 0.1 10*3/uL (ref 0.0–0.1)
Basophils Relative: 1.4 % (ref 0.0–3.0)
Eosinophils Absolute: 0.7 10*3/uL (ref 0.0–0.7)
Eosinophils Relative: 7.8 % — ABNORMAL HIGH (ref 0.0–5.0)
HCT: 45.5 % (ref 39.0–52.0)
Hemoglobin: 15.4 g/dL (ref 13.0–17.0)
Lymphocytes Relative: 36.7 % (ref 12.0–46.0)
Lymphs Abs: 3.4 10*3/uL (ref 0.7–4.0)
MCHC: 33.8 g/dL (ref 30.0–36.0)
MCV: 88.2 fl (ref 78.0–100.0)
Monocytes Absolute: 0.7 10*3/uL (ref 0.1–1.0)
Monocytes Relative: 7.9 % (ref 3.0–12.0)
Neutro Abs: 4.3 10*3/uL (ref 1.4–7.7)
Neutrophils Relative %: 46.2 % (ref 43.0–77.0)
Platelets: 202 10*3/uL (ref 150.0–400.0)
RBC: 5.16 Mil/uL (ref 4.22–5.81)
RDW: 13.6 % (ref 11.5–15.5)
WBC: 9.3 10*3/uL (ref 4.0–10.5)

## 2020-01-08 LAB — FOLATE: Folate: 12.4 ng/mL (ref >5.9)

## 2020-01-08 LAB — VITAMIN B12: Vitamin B-12: 725 pg/mL (ref 211–911)

## 2020-01-08 LAB — SEDIMENTATION RATE: Sed Rate: 11 mm/hr (ref 0–20)

## 2020-01-08 NOTE — Progress Notes (Signed)
Subjective:  Patient ID: Ethan Crosby, male    DOB: 09/13/1965  Age: 54 y.o. MRN: [id number]  CC: Abdominal Pain  This visit occurred during the SARS-CoV-2 public health emergency.  Safety protocols were in place, including screening questions prior to the visit, additional usage of staff PPE, and extensive cleaning of exam room while observing appropriate contact time as indicated for disinfecting solutions.    HPI RULON ABDALLA presents for f/up - He complains of a 4-week history of left lower quadrant abdominal pain that occasionally radiates into his left hip.  He has also had some left testicular discomfort.  He has a history of a cystic lesion in the left scrotum.  He saw a urologist years ago and was told that surgical intervention was not indicated.  He continues to be concerned about the area as it causes discomfort.  He thought his recent symptoms were related to an infected epididymal cyst so he took a course of Augmentin.  This did not help his symptoms much.  He describes the discomfort as intermittently dull and sharp with movement.  He denies nausea, vomiting, rash, lymphadenopathy, fever, chills, dysuria, or hematuria.  Outpatient Medications Prior to Visit  Medication Sig Dispense Refill  . Multiple Vitamin (MULTIVITAMIN) tablet Take 1 tablet by mouth daily.    . rosuvastatin (CRESTOR) 20 MG tablet Take 1 tablet (20 mg total) by mouth daily. 90 tablet 0  . venlafaxine XR (EFFEXOR-XR) 75 MG 24 hr capsule Take 1 capsule (75 mg total) by mouth daily with breakfast. 90 capsule 0  . aspirin EC 81 MG tablet Take 1 tablet (81 mg total) by mouth daily. (Patient not taking: Reported on 01/08/2020) 90 tablet 3   No facility-administered medications prior to visit.    ROS Review of Systems  Constitutional: Negative.  Negative for chills, diaphoresis, fatigue and fever.  HENT: Negative.  Negative for sore throat.   Eyes: Negative.   Respiratory: Negative for cough, chest  tightness, shortness of breath and wheezing.   Cardiovascular: Negative for chest pain, palpitations and leg swelling.  Gastrointestinal: Positive for abdominal pain. Negative for constipation, diarrhea, nausea and vomiting.  Endocrine: Negative.   Genitourinary: Positive for scrotal swelling. Negative for decreased urine volume, difficulty urinating, discharge, dysuria, flank pain, genital sores, hematuria, testicular pain and urgency.  Musculoskeletal: Negative.   Skin: Negative.  Negative for color change and rash.  Neurological: Negative.  Negative for dizziness, weakness, light-headedness, numbness and headaches.  Hematological: Negative for adenopathy. Does not bruise/bleed easily.  Psychiatric/Behavioral: Negative.     Objective:  BP (!) 142/82 (BP Location: Left Arm, Patient Position: Sitting, Cuff Size: Large)   Pulse 76   Temp 98.3 F (36.8 C) (Oral)   Resp 16   Ht 5\' 6"  (1.676 m)   Wt 220 lb (99.8 kg)   SpO2 97%   BMI 35.51 kg/m   BP Readings from Last 3 Encounters:  01/08/20 (!) 142/82  10/31/19 134/90  10/23/19 124/78    Wt Readings from Last 3 Encounters:  01/08/20 220 lb (99.8 kg)  10/31/19 217 lb (98.4 kg)  10/23/19 219 lb (99.3 kg)    Physical Exam Vitals reviewed. Exam conducted with a chaperone present 10/25/19).  Constitutional:      Appearance: He is well-developed. He is not ill-appearing.  HENT:     Nose: Nose normal.     Mouth/Throat:     Mouth: Mucous membranes are moist.  Eyes:     General: No  scleral icterus.    Conjunctiva/sclera: Conjunctivae normal.  Cardiovascular:     Rate and Rhythm: Normal rate.     Heart sounds: No murmur heard.   Pulmonary:     Effort: Pulmonary effort is normal.     Breath sounds: No stridor. No wheezing, rhonchi or rales.  Abdominal:     General: Bowel sounds are normal. There is no distension.     Palpations: There is no hepatomegaly, splenomegaly or mass.     Tenderness: There is abdominal  tenderness in the left lower quadrant. There is no guarding or rebound.     Hernia: No hernia is present.  Genitourinary:    Pubic Area: No rash.      Penis: Normal and circumcised. No discharge, swelling or lesions.      Testes:        Right: Mass, tenderness or swelling not present.        Left: Mass present. Tenderness or swelling not present.     Epididymis:     Right: Normal. Not inflamed or enlarged. No mass or tenderness.     Left: Normal. Not inflamed or enlarged. No mass or tenderness.    Musculoskeletal:     Cervical back: Neck supple.  Lymphadenopathy:     Cervical: No cervical adenopathy.  Neurological:     Mental Status: He is alert.     Lab Results  Component Value Date   WBC 9.3 01/08/2020   HGB 15.4 01/08/2020   HCT 45.5 01/08/2020   PLT 202.0 01/08/2020   GLUCOSE 97 10/23/2019   CHOL 169 10/23/2019   TRIG 50.0 10/23/2019   HDL 47.00 10/23/2019   LDLDIRECT 236.0 12/30/2015   LDLCALC 112 (H) 10/23/2019   ALT 22 10/23/2019   AST 27 10/23/2019   NA 138 10/23/2019   K 5.9 Hemolysis seen.. (H) 10/23/2019   CL 104 10/23/2019   CREATININE 0.78 10/23/2019   BUN 11 10/23/2019   CO2 23 10/23/2019   TSH 1.15 10/23/2019   PSA 0.87 10/23/2019   HGBA1C 5.3 12/30/2015    CT ABDOMEN PELVIS W CONTRAST  Result Date: 06/02/2016 CLINICAL DATA:  Right upper quadrant abdominal pain and tenderness for 2 weeks. Elevated white blood cell count. EXAM: CT ABDOMEN AND PELVIS WITH CONTRAST TECHNIQUE: Multidetector CT imaging of the abdomen and pelvis was performed using the standard protocol following bolus administration of intravenous contrast. CONTRAST:  ISOVUE-300 IOPAMIDOL (ISOVUE-300) INJECTION 61% COMPARISON:  None. FINDINGS: Lower chest: Clear lung bases.  Heart normal in size. Hepatobiliary: No focal liver abnormality is seen. No gallstones, gallbladder wall thickening, or biliary dilatation. Gallbladder is mostly decompressed. Pancreas: Unremarkable. No pancreatic  ductal dilatation or surrounding inflammatory changes. Spleen: Normal in size without focal abnormality. Adrenals/Urinary Tract: No adrenal masses. 5.9 cm lower pole right renal cyst. No other renal masses. Small nonobstructing stone in the midpole the right kidney. No other intrarenal stones. No hydronephrosis. Normal ureters. Normal bladder. Stomach/Bowel: Stomach is within normal limits. Appendix appears normal. No evidence of bowel wall thickening, distention, or inflammatory changes. Vascular/Lymphatic: No significant vascular findings are present. No enlarged abdominal or pelvic lymph nodes. Reproductive: Unremarkable Other: No abdominal wall hernia or abnormality. No abdominopelvic ascites. Musculoskeletal: Status post interbody fusion at L4-L5. No fracture or acute bony abnormality. No osteoblastic or osteolytic lesions. IMPRESSION: 1. No acute findings. No findings to account for right upper quadrant abdominal pain or elevated white blood cell count. Normal appendix visualized. Electronically Signed   By: Amie Portland  M.D.   On: 06/02/2016 17:06   DG Hip Unilat W OR W/O Pelvis 2-3 Views Left  Result Date: 01/09/2020 CLINICAL DATA:  Left hip pain for several weeks without known injury. EXAM: DG HIP (WITH OR WITHOUT PELVIS) 2-3V LEFT COMPARISON:  None. FINDINGS: There is no evidence of hip fracture or dislocation. There is no evidence of arthropathy or other focal bone abnormality. IMPRESSION: Negative. Electronically Signed   By: Lupita Raider M.D.   On: 01/09/2020 09:13    Assessment & Plan:   Treylen was seen today for abdominal pain.  Diagnoses and all orders for this visit:  Acute hip pain, left- Plain films are normal.  I think this pain is radiating from the scrotum and abdomen. -     DG Hip Unilat W OR W/O Pelvis 2-3 Views Left; Future -     Sedimentation rate; Future -     Sedimentation rate  Thrombocytopenia (HCC)- His platelet count is normal now.  B12 and folate levels are  normal. -     CBC with Differential/Platelet; Future -     Vitamin B12; Future -     Folate; Future -     Folate -     Vitamin B12 -     CBC with Differential/Platelet  Left lower quadrant abdominal tenderness without rebound tenderness- He has a 4-week history of left lower quadrant abdominal pain microscopic hematuria.  He has a history of tobacco abuse.  I recommended that he undergo a renal CT scan to screen for nephrolithiasis, renal cancer, and bladder cancer. -     POCT Urinalysis Dipstick (Automated) -     CT RENAL STONE STUDY; Future  Spermatocele of epididymis, single- This continues to bother him.  I have asked him to see urology to consider additional treatment options. -     Ambulatory referral to Urology  Other microscopic hematuria -     CT RENAL STONE STUDY; Future   I am having Allayne Gitelman. Gomillion maintain his aspirin EC, multivitamin, venlafaxine XR, and rosuvastatin.  No orders of the defined types were placed in this encounter.    Follow-up: Return in about 3 weeks (around 01/29/2020).  Sanda Linger, MD

## 2020-01-08 NOTE — Patient Instructions (Signed)
Abdominal Pain, Adult Pain in the abdomen (abdominal pain) can be caused by many things. Often, abdominal pain is not serious and it gets better with no treatment or by being treated at home. However, sometimes abdominal pain is serious. Your health care provider will ask questions about your medical history and do a physical exam to try to determine the cause of your abdominal pain. Follow these instructions at home:  Medicines  Take over-the-counter and prescription medicines only as told by your health care provider.  Do not take a laxative unless told by your health care provider. General instructions  Watch your condition for any changes.  Drink enough fluid to keep your urine pale yellow.  Keep all follow-up visits as told by your health care provider. This is important. Contact a health care provider if:  Your abdominal pain changes or gets worse.  You are not hungry or you lose weight without trying.  You are constipated or have diarrhea for more than 2-3 days.  You have pain when you urinate or have a bowel movement.  Your abdominal pain wakes you up at night.  Your pain gets worse with meals, after eating, or with certain foods.  You are vomiting and cannot keep anything down.  You have a fever.  You have blood in your urine. Get help right away if:  Your pain does not go away as soon as your health care provider told you to expect.  You cannot stop vomiting.  Your pain is only in areas of the abdomen, such as the right side or the left lower portion of the abdomen. Pain on the right side could be caused by appendicitis.  You have bloody or black stools, or stools that look like tar.  You have severe pain, cramping, or bloating in your abdomen.  You have signs of dehydration, such as: ? Dark urine, very little urine, or no urine. ? Cracked lips. ? Dry mouth. ? Sunken eyes. ? Sleepiness. ? Weakness.  You have trouble breathing or chest  pain. Summary  Often, abdominal pain is not serious and it gets better with no treatment or by being treated at home. However, sometimes abdominal pain is serious.  Watch your condition for any changes.  Take over-the-counter and prescription medicines only as told by your health care provider.  Contact a health care provider if your abdominal pain changes or gets worse.  Get help right away if you have severe pain, cramping, or bloating in your abdomen. This information is not intended to replace advice given to you by your health care provider. Make sure you discuss any questions you have with your health care provider. Document Revised: 10/29/2018 Document Reviewed: 10/29/2018 Elsevier Patient Education  2020 [employer] Inc[employer]  

## 2020-01-09 ENCOUNTER — Encounter: Payer: Self-pay | Admitting: Internal Medicine

## 2020-01-09 DIAGNOSIS — Z720 Tobacco use: Secondary | ICD-10-CM | POA: Diagnosis not present

## 2020-01-13 ENCOUNTER — Other Ambulatory Visit: Payer: Self-pay

## 2020-01-13 ENCOUNTER — Ambulatory Visit (INDEPENDENT_AMBULATORY_CARE_PROVIDER_SITE_OTHER)
Admission: RE | Admit: 2020-01-13 | Discharge: 2020-01-13 | Disposition: A | Discharge status: Home / Self Care | Payer: BC Managed Care – PPO | Source: Ambulatory Visit | Attending: Internal Medicine | Admitting: Internal Medicine

## 2020-01-13 DIAGNOSIS — R10814 Left lower quadrant abdominal tenderness: Secondary | ICD-10-CM | POA: Diagnosis not present

## 2020-01-13 DIAGNOSIS — I700 Atherosclerosis of aorta: Secondary | ICD-10-CM | POA: Diagnosis not present

## 2020-01-13 DIAGNOSIS — R3129 Other microscopic hematuria: Secondary | ICD-10-CM | POA: Diagnosis not present

## 2020-01-13 DIAGNOSIS — N200 Calculus of kidney: Secondary | ICD-10-CM | POA: Diagnosis not present

## 2020-01-14 ENCOUNTER — Encounter: Payer: Self-pay | Admitting: Internal Medicine

## 2020-01-15 ENCOUNTER — Encounter: Payer: Self-pay | Admitting: Internal Medicine

## 2020-01-17 ENCOUNTER — Other Ambulatory Visit: Payer: Self-pay

## 2020-01-17 ENCOUNTER — Other Ambulatory Visit: Payer: BC Managed Care – PPO

## 2020-01-17 ENCOUNTER — Other Ambulatory Visit: Payer: Self-pay | Admitting: Internal Medicine

## 2020-01-17 DIAGNOSIS — R3129 Other microscopic hematuria: Secondary | ICD-10-CM | POA: Diagnosis not present

## 2020-01-18 LAB — URINALYSIS, ROUTINE W REFLEX MICROSCOPIC
Bilirubin Urine: NEGATIVE
Glucose, UA: NEGATIVE
Hgb urine dipstick: NEGATIVE
Ketones, ur: NEGATIVE
Leukocytes,Ua: NEGATIVE
Nitrite: NEGATIVE
Protein, ur: NEGATIVE
Specific Gravity, Urine: 1.015 (ref 1.001–1.03)
pH: 6 (ref 5.0–8.0)

## 2020-01-19 LAB — CULTURE, URINE COMPREHENSIVE: RESULT:: NO GROWTH

## 2020-01-21 LAB — CHLAMYDIA/GONOCOCCUS/TRICHOMONAS, NAA
Chlamydia by NAA: NEGATIVE
Gonococcus by NAA: NEGATIVE
Trich vag by NAA: NEGATIVE

## 2020-01-24 ENCOUNTER — Other Ambulatory Visit: Payer: Self-pay | Admitting: Internal Medicine

## 2020-01-24 DIAGNOSIS — E785 Hyperlipidemia, unspecified: Secondary | ICD-10-CM

## 2020-01-24 DIAGNOSIS — F329 Major depressive disorder, single episode, unspecified: Secondary | ICD-10-CM

## 2020-01-24 DIAGNOSIS — F32A Depression, unspecified: Secondary | ICD-10-CM

## 2020-02-06 DIAGNOSIS — N50812 Left testicular pain: Secondary | ICD-10-CM | POA: Diagnosis not present

## 2020-02-06 DIAGNOSIS — N4342 Spermatocele of epididymis, multiple: Secondary | ICD-10-CM | POA: Diagnosis not present

## 2020-07-16 ENCOUNTER — Other Ambulatory Visit: Payer: Self-pay | Admitting: Internal Medicine

## 2020-07-16 DIAGNOSIS — F32A Depression, unspecified: Secondary | ICD-10-CM

## 2020-07-16 DIAGNOSIS — E785 Hyperlipidemia, unspecified: Secondary | ICD-10-CM

## 2020-09-18 ENCOUNTER — Other Ambulatory Visit: Payer: Self-pay | Admitting: Internal Medicine

## 2020-09-18 DIAGNOSIS — H6090 Unspecified otitis externa, unspecified ear: Secondary | ICD-10-CM | POA: Insufficient documentation

## 2020-09-18 DIAGNOSIS — H6011 Cellulitis of right external ear: Secondary | ICD-10-CM | POA: Insufficient documentation

## 2020-09-18 MED ORDER — AMOXICILLIN-POT CLAVULANATE 875-125 MG PO TABS: 1.0000 | ORAL_TABLET | Freq: Two times a day (BID) | ORAL | 0 refills | Status: AC

## 2020-10-13 ENCOUNTER — Other Ambulatory Visit: Payer: Self-pay | Admitting: Internal Medicine

## 2020-10-13 DIAGNOSIS — E785 Hyperlipidemia, unspecified: Secondary | ICD-10-CM

## 2020-10-13 DIAGNOSIS — F32A Depression, unspecified: Secondary | ICD-10-CM

## 2021-01-13 ENCOUNTER — Other Ambulatory Visit: Payer: Self-pay | Admitting: Internal Medicine

## 2021-01-13 DIAGNOSIS — E785 Hyperlipidemia, unspecified: Secondary | ICD-10-CM

## 2021-01-13 DIAGNOSIS — F32A Depression, unspecified: Secondary | ICD-10-CM

## 2021-04-03 ENCOUNTER — Other Ambulatory Visit: Payer: Self-pay | Admitting: Internal Medicine

## 2021-04-03 DIAGNOSIS — F32A Depression, unspecified: Secondary | ICD-10-CM

## 2021-04-03 DIAGNOSIS — E785 Hyperlipidemia, unspecified: Secondary | ICD-10-CM

## 2021-06-09 ENCOUNTER — Encounter: Payer: Self-pay | Admitting: Internal Medicine

## 2021-06-09 ENCOUNTER — Ambulatory Visit (INDEPENDENT_AMBULATORY_CARE_PROVIDER_SITE_OTHER): Payer: BC Managed Care – PPO | Admitting: Internal Medicine

## 2021-06-09 ENCOUNTER — Other Ambulatory Visit: Payer: Self-pay

## 2021-06-09 VITALS — BP 144/94 | HR 93 | Temp 98.3°F | Resp 16 | Ht 66.0 in | Wt 220.0 lb

## 2021-06-09 DIAGNOSIS — R5382 Chronic fatigue, unspecified: Secondary | ICD-10-CM | POA: Diagnosis not present

## 2021-06-09 DIAGNOSIS — I10 Essential (primary) hypertension: Secondary | ICD-10-CM | POA: Insufficient documentation

## 2021-06-09 DIAGNOSIS — Z0001 Encounter for general adult medical examination with abnormal findings: Secondary | ICD-10-CM | POA: Insufficient documentation

## 2021-06-09 DIAGNOSIS — Z23 Encounter for immunization: Secondary | ICD-10-CM

## 2021-06-09 DIAGNOSIS — Z1159 Encounter for screening for other viral diseases: Secondary | ICD-10-CM | POA: Diagnosis not present

## 2021-06-09 LAB — BASIC METABOLIC PANEL
BUN: 17 mg/dL (ref 6–23)
CO2: 26 mEq/L (ref 19–32)
Calcium: 9.4 mg/dL (ref 8.4–10.5)
Chloride: 104 mEq/L (ref 96–112)
Creatinine, Ser: 0.79 mg/dL (ref 0.40–1.50)
GFR: 100.28 mL/min (ref >60.00)
Glucose, Bld: 93 mg/dL (ref 70–99)
Potassium: 4.5 mEq/L (ref 3.5–5.1)
Sodium: 138 mEq/L (ref 135–145)

## 2021-06-09 LAB — URINALYSIS, ROUTINE W REFLEX MICROSCOPIC
Bilirubin Urine: NEGATIVE
Hgb urine dipstick: NEGATIVE
Ketones, ur: NEGATIVE
Leukocytes,Ua: NEGATIVE
Nitrite: NEGATIVE
Specific Gravity, Urine: 1.01 (ref 1.000–1.030)
Total Protein, Urine: NEGATIVE
Urine Glucose: NEGATIVE
Urobilinogen, UA: 0.2 (ref 0.0–1.0)
pH: 7.5 (ref 5.0–8.0)

## 2021-06-09 LAB — LIPID PANEL
Cholesterol: 134 mg/dL (ref 0–200)
HDL: 45.5 mg/dL (ref >39.00)
LDL Cholesterol: 76 mg/dL (ref 0–99)
NonHDL: 88.46
Total CHOL/HDL Ratio: 3
Triglycerides: 61 mg/dL (ref 0.0–149.0)
VLDL: 12.2 mg/dL (ref 0.0–40.0)

## 2021-06-09 LAB — CBC WITH DIFFERENTIAL/PLATELET
Basophils Absolute: 0.1 10*3/uL (ref 0.0–0.1)
Basophils Relative: 1.3 % (ref 0.0–3.0)
Eosinophils Absolute: 0.4 10*3/uL (ref 0.0–0.7)
Eosinophils Relative: 6.7 % — ABNORMAL HIGH (ref 0.0–5.0)
HCT: 45.5 % (ref 39.0–52.0)
Hemoglobin: 15.1 g/dL (ref 13.0–17.0)
Lymphocytes Relative: 41.5 % (ref 12.0–46.0)
Lymphs Abs: 2.5 10*3/uL (ref 0.7–4.0)
MCHC: 33.1 g/dL (ref 30.0–36.0)
MCV: 84.9 fl (ref 78.0–100.0)
Monocytes Absolute: 0.6 10*3/uL (ref 0.1–1.0)
Monocytes Relative: 9.8 % (ref 3.0–12.0)
Neutro Abs: 2.5 10*3/uL (ref 1.4–7.7)
Neutrophils Relative %: 40.7 % — ABNORMAL LOW (ref 43.0–77.0)
Platelets: 213 10*3/uL (ref 150.0–400.0)
RBC: 5.35 Mil/uL (ref 4.22–5.81)
RDW: 13.4 % (ref 11.5–15.5)
WBC: 6.1 10*3/uL (ref 4.0–10.5)

## 2021-06-09 LAB — HEPATIC FUNCTION PANEL
ALT: 40 U/L (ref 0–53)
AST: 31 U/L (ref 0–37)
Albumin: 4.7 g/dL (ref 3.5–5.2)
Alkaline Phosphatase: 70 U/L (ref 39–117)
Bilirubin, Direct: 0.1 mg/dL (ref 0.0–0.3)
Total Bilirubin: 0.5 mg/dL (ref 0.2–1.2)
Total Protein: 7 g/dL (ref 6.0–8.3)

## 2021-06-09 LAB — CORTISOL: Cortisol, Plasma: 5.3 ug/dL

## 2021-06-09 LAB — PSA: PSA: 1.17 ng/mL (ref 0.10–4.00)

## 2021-06-09 LAB — TSH: TSH: 0.88 u[IU]/mL (ref 0.35–5.50)

## 2021-06-09 NOTE — Progress Notes (Signed)
Subjective:  Patient ID: Ethan Crosby, male    DOB: 1966/03/27  Age: 55 y.o. MRN: [id number]  CC: Annual Exam and Hypertension  This visit occurred during the SARS-CoV-2 public health emergency.  Safety protocols were in place, including screening questions prior to the visit, additional usage of staff PPE, and extensive cleaning of exam room while observing appropriate contact time as indicated for disinfecting solutions.    HPI Ethan Crosby presents for a CPX and f/up -   He does not exercise but he is active.  When he is active he does not experience chest pain, shortness of breath, diaphoresis, dizziness, lightheadedness, or edema.  He had an episode of fatigue about a month ago but that has improved.  Outpatient Medications Prior to Visit  Medication Sig Dispense Refill   Multiple Vitamin (MULTIVITAMIN) tablet Take 1 tablet by mouth daily.     rosuvastatin (CRESTOR) 20 MG tablet TAKE 1 TABLET ONCE DAILY. 90 tablet 0   venlafaxine XR (EFFEXOR-XR) 75 MG 24 hr capsule TAKE 1 CAPSULE ONCE DAILY WITH BREAKFAST. 90 capsule 0   No facility-administered medications prior to visit.    ROS Review of Systems  Constitutional:  Positive for fatigue and unexpected weight change (wt gain). Negative for diaphoresis.  HENT: Negative.    Eyes: Negative.   Respiratory: Negative.  Negative for cough, chest tightness, shortness of breath and wheezing.   Cardiovascular:  Negative for chest pain, palpitations and leg swelling.  Gastrointestinal:  Negative for abdominal pain, constipation, diarrhea and vomiting.  Endocrine: Negative.   Genitourinary: Negative.  Negative for penile swelling, scrotal swelling and testicular pain.  Musculoskeletal: Negative.  Negative for arthralgias and myalgias.  Skin: Negative.  Negative for color change and rash.  Neurological: Negative.  Negative for dizziness, weakness, light-headedness and numbness.  Hematological:  Negative for adenopathy. Does not  bruise/bleed easily.  Psychiatric/Behavioral: Negative.     Objective:  BP (!) 144/94 (BP Location: Right Arm, Patient Position: Sitting, Cuff Size: Large)   Pulse 93   Temp 98.3 F (36.8 C) (Oral)   Resp 16   Ht 5\' 6"  (1.676 m)   Wt 220 lb (99.8 kg)   SpO2 97%   BMI 35.51 kg/m   BP Readings from Last 3 Encounters:  06/09/21 (!) 144/94  01/08/20 (!) 142/82  10/31/19 134/90    Wt Readings from Last 3 Encounters:  06/09/21 220 lb (99.8 kg)  01/08/20 220 lb (99.8 kg)  10/31/19 217 lb (98.4 kg)    Physical Exam Vitals reviewed.  Constitutional:      Appearance: Normal appearance.  HENT:     Nose: Nose normal.     Mouth/Throat:     Mouth: Mucous membranes are moist.  Eyes:     General: No scleral icterus.    Conjunctiva/sclera: Conjunctivae normal.  Cardiovascular:     Rate and Rhythm: Normal rate and regular rhythm.     Heart sounds: Normal heart sounds, S1 normal and S2 normal. No murmur heard.    Comments: EKG- NSR with SA, 80 bpm Flat T waves in I, V5, V6 are old No LVH or Qwaves Pulmonary:     Effort: Pulmonary effort is normal.     Breath sounds: No stridor. No wheezing, rhonchi or rales.  Abdominal:     General: Abdomen is flat.     Palpations: There is no mass.     Tenderness: There is no abdominal tenderness. There is no guarding.     Hernia:  No hernia is present. There is no hernia in the left inguinal area or right inguinal area.  Genitourinary:    Penis: Circumcised.      Testes: Normal.        Right: Mass, tenderness or swelling not present.        Left: Mass, tenderness or swelling not present.     Epididymis:     Right: Normal. Not inflamed or enlarged. No mass.     Left: Normal. Not inflamed or enlarged. No mass.     Prostate: Enlarged. Not tender and no nodules present.     Rectum: Normal. Guaiac result negative. No mass, tenderness, anal fissure, external hemorrhoid or internal hemorrhoid. Normal anal tone.  Musculoskeletal:        General:  No swelling.     Cervical back: Neck supple.     Right lower leg: No edema.     Left lower leg: No edema.  Lymphadenopathy:     Cervical: No cervical adenopathy.     Lower Body: No right inguinal adenopathy. No left inguinal adenopathy.  Skin:    General: Skin is warm and dry.     Findings: No lesion.  Neurological:     General: No focal deficit present.     Mental Status: He is alert. Mental status is at baseline.  Psychiatric:        Mood and Affect: Mood normal.        Behavior: Behavior normal.        Thought Content: Thought content normal.        Judgment: Judgment normal.    Lab Results  Component Value Date   WBC 6.1 06/09/2021   HGB 15.1 06/09/2021   HCT 45.5 06/09/2021   PLT 213.0 06/09/2021   GLUCOSE 93 06/09/2021   CHOL 134 06/09/2021   TRIG 61.0 06/09/2021   HDL 45.50 06/09/2021   LDLDIRECT 236.0 12/30/2015   LDLCALC 76 06/09/2021   ALT 40 06/09/2021   AST 31 06/09/2021   NA 138 06/09/2021   K 4.5 06/09/2021   CL 104 06/09/2021   CREATININE 0.79 06/09/2021   BUN 17 06/09/2021   CO2 26 06/09/2021   TSH 0.88 06/09/2021   PSA 1.17 06/09/2021   HGBA1C 5.3 12/30/2015    CT RENAL STONE STUDY  Result Date: 01/13/2020 CLINICAL DATA:  Left flank and groin pain for 2 weeks. Microscopic hematuria. EXAM: CT ABDOMEN AND PELVIS WITHOUT CONTRAST TECHNIQUE: Multidetector CT imaging of the abdomen and pelvis was performed following the standard protocol without IV contrast. COMPARISON:  06/02/2016 FINDINGS: Lower chest: No acute findings. Hepatobiliary: No mass visualized on this unenhanced exam. Gallbladder is unremarkable. No evidence of biliary ductal dilatation. Pancreas: No mass or inflammatory process visualized on this unenhanced exam. Spleen:  Within normal limits in size. Adrenals/Urinary tract: 3 mm calculus again seen in midpole of right kidney. No evidence of ureteral calculi or hydronephrosis. 6 cm fluid attenuation cyst in lower pole of right kidney shows no  significant change. Unremarkable unopacified urinary bladder. Stomach/Bowel: No evidence of obstruction, inflammatory process, or abnormal fluid collections. Normal appendix visualized. Vascular/Lymphatic: No pathologically enlarged lymph nodes identified. No evidence of abdominal aortic aneurysm. Aortic atherosclerosis noted. Reproductive:  No mass or other significant abnormality. Other:  None. Musculoskeletal:  No suspicious bone lesions identified. IMPRESSION: 3 mm right renal calculus. No evidence of ureteral calculi, hydronephrosis, or other acute findings. Aortic Atherosclerosis (ICD10-I70.0). Electronically Signed   By: Marlaine Hind M.D.   On: 01/13/2020  19:13    Assessment & Plan:   Ethan Crosby was seen today for annual exam and hypertension.  Diagnoses and all orders for this visit:  Encounter for general adult medical examination with abnormal findings- Exam completed, labs reviewed, vaccines reviewed and updated, cancer screenings are up-to-date, patient education was given. -     Lipid panel; Future -     PSA; Future -     Hepatitis C antibody; Future -     HIV Antibody (routine testing w rflx); Future -     HIV Antibody (routine testing w rflx) -     Hepatitis C antibody -     PSA -     Lipid panel  Primary hypertension- He has developed stage I hypertension.  Will check labs to screen for secondary causes and endorgan damage.  His EKG is reassuring.  Will treat this with lifestyle modifications and an ARB. -     Aldosterone + renin activity w/ ratio; Future -     CBC with Differential/Platelet; Future -     Cortisol; Future -     TSH; Future -     Urinalysis, Routine w reflex microscopic; Future -     Hepatic function panel; Future -     Basic metabolic panel; Future -     EKG 12-Lead -     Basic metabolic panel -     Hepatic function panel -     Urinalysis, Routine w reflex microscopic -     TSH -     Cortisol -     CBC with Differential/Platelet -     Aldosterone + renin  activity w/ ratio -     olmesartan (BENICAR) 20 MG tablet; Take 1 tablet (20 mg total) by mouth daily.  Chronic fatigue- Will check labs to screen for secondary causes.  I have encouraged him to improve his activity level. -     CBC with Differential/Platelet; Future -     Testosterone Total,Free,Bio, Males; Future -     Cortisol; Future -     TSH; Future -     Hepatic function panel; Future -     Basic metabolic panel; Future -     Basic metabolic panel -     Hepatic function panel -     TSH -     Cortisol -     Testosterone Total,Free,Bio, Males -     CBC with Differential/Platelet  Need for hepatitis C screening test -     Hepatitis C antibody; Future -     Hepatitis C antibody  Other orders -     Flu Vaccine QUAD 6+ mos PF IM (Fluarix Quad PF)  I am having Ethan Crosby. Ethan Crosby start on olmesartan. I am also having him maintain his multivitamin, rosuvastatin, and venlafaxine XR.  Meds ordered this encounter  Medications   olmesartan (BENICAR) 20 MG tablet    Sig: Take 1 tablet (20 mg total) by mouth daily.    Dispense:  90 tablet    Refill:  1      Follow-up: Return in about 6 months (around 12/08/2021).  Sanda Linger, MD

## 2021-06-09 NOTE — Patient Instructions (Signed)

## 2021-06-10 ENCOUNTER — Encounter: Payer: Self-pay | Admitting: Internal Medicine

## 2021-06-10 MED ORDER — OLMESARTAN MEDOXOMIL 20 MG PO TABS: 20.0000 mg | ORAL_TABLET | Freq: Every day | ORAL | 1 refills | Status: DC

## 2021-06-14 LAB — TESTOSTERONE TOTAL,FREE,BIO, MALES
Albumin: 4.6 g/dL (ref 3.6–5.1)
Sex Hormone Binding: 37 nmol/L (ref 10–50)
Testosterone, Bioavailable: 120 ng/dL (ref 110.0–575.0)
Testosterone, Free: 57.2 pg/mL (ref 46.0–224.0)
Testosterone: 473 ng/dL (ref 250–827)

## 2021-06-14 LAB — HEPATITIS C ANTIBODY
Hepatitis C Ab: NONREACTIVE
SIGNAL TO CUT-OFF: 0.13 (ref <1.00)

## 2021-06-14 LAB — ALDOSTERONE + RENIN ACTIVITY W/ RATIO
ALDO / PRA Ratio: 4.2 Ratio (ref 0.9–28.9)
Aldosterone: 7 ng/dL
Renin Activity: 1.65 ng/mL/h (ref 0.25–5.82)

## 2021-06-14 LAB — HIV ANTIBODY (ROUTINE TESTING W REFLEX): HIV 1&2 Ab, 4th Generation: NONREACTIVE

## 2021-07-07 ENCOUNTER — Other Ambulatory Visit: Payer: Self-pay | Admitting: Internal Medicine

## 2021-07-07 DIAGNOSIS — F32A Depression, unspecified: Secondary | ICD-10-CM

## 2021-08-10 ENCOUNTER — Other Ambulatory Visit: Payer: Self-pay | Admitting: Internal Medicine

## 2021-08-10 ENCOUNTER — Encounter: Payer: Self-pay | Admitting: Internal Medicine

## 2021-08-10 DIAGNOSIS — U071 COVID-19: Secondary | ICD-10-CM | POA: Insufficient documentation

## 2021-08-10 MED ORDER — NIRMATRELVIR/RITONAVIR (PAXLOVID)TABLET: 3.0000 | ORAL_TABLET | Freq: Two times a day (BID) | ORAL | 0 refills | Status: AC

## 2021-08-10 NOTE — Telephone Encounter (Signed)
Please advise 

## 2021-09-02 ENCOUNTER — Other Ambulatory Visit: Payer: Self-pay | Admitting: Internal Medicine

## 2021-09-02 DIAGNOSIS — E785 Hyperlipidemia, unspecified: Secondary | ICD-10-CM

## 2021-09-15 ENCOUNTER — Other Ambulatory Visit: Payer: Self-pay | Admitting: Internal Medicine

## 2021-09-15 DIAGNOSIS — B002 Herpesviral gingivostomatitis and pharyngotonsillitis: Secondary | ICD-10-CM | POA: Insufficient documentation

## 2021-09-15 MED ORDER — VALACYCLOVIR HCL 1 G PO TABS: 2000.0000 mg | ORAL_TABLET | Freq: Two times a day (BID) | ORAL | 3 refills | Status: DC

## 2021-10-07 ENCOUNTER — Other Ambulatory Visit: Payer: Self-pay | Admitting: Internal Medicine

## 2021-10-07 DIAGNOSIS — F32A Depression, unspecified: Secondary | ICD-10-CM

## 2021-12-06 ENCOUNTER — Other Ambulatory Visit: Payer: Self-pay | Admitting: Internal Medicine

## 2021-12-06 DIAGNOSIS — I10 Essential (primary) hypertension: Secondary | ICD-10-CM

## 2021-12-06 DIAGNOSIS — E785 Hyperlipidemia, unspecified: Secondary | ICD-10-CM

## 2021-12-28 ENCOUNTER — Other Ambulatory Visit: Payer: Self-pay | Admitting: Internal Medicine

## 2021-12-28 DIAGNOSIS — F32A Depression, unspecified: Secondary | ICD-10-CM

## 2022-01-30 ENCOUNTER — Encounter: Payer: Self-pay | Admitting: Internal Medicine

## 2022-02-07 ENCOUNTER — Encounter: Payer: Self-pay | Admitting: Internal Medicine

## 2022-02-07 ENCOUNTER — Ambulatory Visit: Payer: BC Managed Care – PPO | Admitting: Internal Medicine

## 2022-02-07 VITALS — BP 112/68 | HR 93 | Temp 98.6°F | Resp 16 | Ht 66.0 in | Wt 197.1 lb

## 2022-02-07 DIAGNOSIS — I10 Essential (primary) hypertension: Secondary | ICD-10-CM | POA: Diagnosis not present

## 2022-02-07 DIAGNOSIS — Z23 Encounter for immunization: Secondary | ICD-10-CM

## 2022-02-07 DIAGNOSIS — F3341 Major depressive disorder, recurrent, in partial remission: Secondary | ICD-10-CM

## 2022-02-07 DIAGNOSIS — Z87891 Personal history of nicotine dependence: Secondary | ICD-10-CM

## 2022-02-07 DIAGNOSIS — R3129 Other microscopic hematuria: Secondary | ICD-10-CM | POA: Diagnosis not present

## 2022-02-07 LAB — CBC WITH DIFFERENTIAL/PLATELET
Basophils Absolute: 0.1 10*3/uL (ref 0.0–0.1)
Basophils Relative: 1 % (ref 0.0–3.0)
Eosinophils Absolute: 0.3 10*3/uL (ref 0.0–0.7)
Eosinophils Relative: 5.4 % — ABNORMAL HIGH (ref 0.0–5.0)
HCT: 42 % (ref 39.0–52.0)
Hemoglobin: 14.3 g/dL (ref 13.0–17.0)
Lymphocytes Relative: 36.6 % (ref 12.0–46.0)
Lymphs Abs: 2.3 10*3/uL (ref 0.7–4.0)
MCHC: 34 g/dL (ref 30.0–36.0)
MCV: 86.7 fl (ref 78.0–100.0)
Monocytes Absolute: 0.5 10*3/uL (ref 0.1–1.0)
Monocytes Relative: 8.4 % (ref 3.0–12.0)
Neutro Abs: 3.1 10*3/uL (ref 1.4–7.7)
Neutrophils Relative %: 48.6 % (ref 43.0–77.0)
Platelets: 197 10*3/uL (ref 150.0–400.0)
RBC: 4.85 Mil/uL (ref 4.22–5.81)
RDW: 13.3 % (ref 11.5–15.5)
WBC: 6.4 10*3/uL (ref 4.0–10.5)

## 2022-02-07 LAB — URINALYSIS, ROUTINE W REFLEX MICROSCOPIC
Bilirubin Urine: NEGATIVE
Hgb urine dipstick: NEGATIVE
Ketones, ur: NEGATIVE
Leukocytes,Ua: NEGATIVE
Nitrite: NEGATIVE
Specific Gravity, Urine: 1.015 (ref 1.000–1.030)
Total Protein, Urine: NEGATIVE
Urine Glucose: NEGATIVE
Urobilinogen, UA: 0.2 (ref 0.0–1.0)
pH: 8 (ref 5.0–8.0)

## 2022-02-07 LAB — BASIC METABOLIC PANEL
BUN: 14 mg/dL (ref 6–23)
CO2: 27 mEq/L (ref 19–32)
Calcium: 9.2 mg/dL (ref 8.4–10.5)
Chloride: 106 mEq/L (ref 96–112)
Creatinine, Ser: 0.95 mg/dL (ref 0.40–1.50)
GFR: 89.93 mL/min (ref >60.00)
Glucose, Bld: 100 mg/dL — ABNORMAL HIGH (ref 70–99)
Potassium: 5.3 mEq/L — ABNORMAL HIGH (ref 3.5–5.1)
Sodium: 140 mEq/L (ref 135–145)

## 2022-02-07 MED ORDER — VENLAFAXINE HCL ER 150 MG PO CP24: 150.0000 mg | ORAL_CAPSULE | Freq: Every day | ORAL | 1 refills | Status: DC

## 2022-02-07 NOTE — Patient Instructions (Signed)
Hypertension, Adult High blood pressure (hypertension) is when the force of blood pumping through the arteries is too strong. The arteries are the blood vessels that carry blood from the heart throughout the body. Hypertension forces the heart to work harder to pump blood and may cause arteries to become narrow or stiff. Untreated or uncontrolled hypertension can lead to a heart attack, heart failure, a stroke, kidney disease, and other problems. A blood pressure reading consists of a higher number over a lower number. Ideally, your blood pressure should be below 120/80. The first ("top") number is called the systolic pressure. It is a measure of the pressure in your arteries as your heart beats. The second ("bottom") number is called the diastolic pressure. It is a measure of the pressure in your arteries as the heart relaxes. What are the causes? The exact cause of this condition is not known. There are some conditions that result in high blood pressure. What increases the risk? Certain factors may make you more likely to develop high blood pressure. Some of these risk factors are under your control, including: Smoking. Not getting enough exercise or physical activity. Being overweight. Having too much fat, sugar, calories, or salt (sodium) in your diet. Drinking too much alcohol. Other risk factors include: Having a personal history of heart disease, diabetes, high cholesterol, or kidney disease. Stress. Having a family history of high blood pressure and high cholesterol. Having obstructive sleep apnea. Age. The risk increases with age. What are the signs or symptoms? High blood pressure may not cause symptoms. Very high blood pressure (hypertensive crisis) may cause: Headache. Fast or irregular heartbeats (palpitations). Shortness of breath. Nosebleed. Nausea and vomiting. Vision changes. Severe chest pain, dizziness, and seizures. How is this diagnosed? This condition is diagnosed by  measuring your blood pressure while you are seated, with your arm resting on a flat surface, your legs uncrossed, and your feet flat on the floor. The cuff of the blood pressure monitor will be placed directly against the skin of your upper arm at the level of your heart. Blood pressure should be measured at least twice using the same arm. Certain conditions can cause a difference in blood pressure between your right and left arms. If you have a high blood pressure reading during one visit or you have normal blood pressure with other risk factors, you may be asked to: Return on a different day to have your blood pressure checked again. Monitor your blood pressure at home for 1 week or longer. If you are diagnosed with hypertension, you may have other blood or imaging tests to help your health care provider understand your overall risk for other conditions. How is this treated? This condition is treated by making healthy lifestyle changes, such as eating healthy foods, exercising more, and reducing your alcohol intake. You may be referred for counseling on a healthy diet and physical activity. Your health care provider may prescribe medicine if lifestyle changes are not enough to get your blood pressure under control and if: Your systolic blood pressure is above 130. Your diastolic blood pressure is above 80. Your personal target blood pressure may vary depending on your medical conditions, your age, and other factors. Follow these instructions at home: Eating and drinking  Eat a diet that is high in fiber and potassium, and low in sodium, added sugar, and fat. An example of this eating plan is called the DASH diet. DASH stands for Dietary Approaches to Stop Hypertension. To eat this way: Eat   plenty of fresh fruits and vegetables. Try to fill one half of your plate at each meal with fruits and vegetables. Eat whole grains, such as whole-wheat pasta, brown rice, or whole-grain bread. Fill about one  fourth of your plate with whole grains. Eat or drink low-fat dairy products, such as skim milk or low-fat yogurt. Avoid fatty cuts of meat, processed or cured meats, and poultry with skin. Fill about one fourth of your plate with lean proteins, such as fish, chicken without skin, beans, eggs, or tofu. Avoid pre-made and processed foods. These tend to be higher in sodium, added sugar, and fat. Reduce your daily sodium intake. Many people with hypertension should eat less than 1,500 mg of sodium a day. Do not drink alcohol if: Your health care provider tells you not to drink. You are pregnant, may be pregnant, or are planning to become pregnant. If you drink alcohol: Limit how much you have to: 0-1 drink a day for women. 0-2 drinks a day for men. Know how much alcohol is in your drink. In the [place] one drink equals one 12 oz bottle of beer (355 mL), one 5 oz glass of wine (148 mL), or one 1 oz glass of hard liquor (44 mL). Lifestyle  Work with your health care provider to maintain a healthy body weight or to lose weight. Ask what an ideal weight is for you. Get at least 30 minutes of exercise that causes your heart to beat faster (aerobic exercise) most days of the week. Activities may include walking, swimming, or biking. Include exercise to strengthen your muscles (resistance exercise), such as Pilates or lifting weights, as part of your weekly exercise routine. Try to do these types of exercises for 30 minutes at least 3 days a week. Do not use any products that contain nicotine or tobacco. These products include cigarettes, chewing tobacco, and vaping devices, such as e-cigarettes. If you need help quitting, ask your health care provider. Monitor your blood pressure at home as told by your health care provider. Keep all follow-up visits. This is important. Medicines Take over-the-counter and prescription medicines only as told by your health care provider. Follow directions carefully. Blood  pressure medicines must be taken as prescribed. Do not skip doses of blood pressure medicine. Doing this puts you at risk for problems and can make the medicine less effective. Ask your health care provider about side effects or reactions to medicines that you should watch for. Contact a health care provider if you: Think you are having a reaction to a medicine you are taking. Have headaches that keep coming back (recurring). Feel dizzy. Have swelling in your ankles. Have trouble with your vision. Get help right away if you: Develop a severe headache or confusion. Have unusual weakness or numbness. Feel faint. Have severe pain in your chest or abdomen. Vomit repeatedly. Have trouble breathing. These symptoms may be an emergency. Get help right away. Call 911. Do not wait to see if the symptoms will go away. Do not drive yourself to the hospital. Summary Hypertension is when the force of blood pumping through your arteries is too strong. If this condition is not controlled, it may put you at risk for serious complications. Your personal target blood pressure may vary depending on your medical conditions, your age, and other factors. For most people, a normal blood pressure is less than 120/80. Hypertension is treated with lifestyle changes, medicines, or a combination of both. Lifestyle changes include losing weight, eating a healthy,   low-sodium diet, exercising more, and limiting alcohol. This information is not intended to replace advice given to you by your health care provider. Make sure you discuss any questions you have with your health care provider. Document Revised: 04/27/2021 Document Reviewed: 04/27/2021 Elsevier Patient Education  2023 Elsevier Inc.  

## 2022-02-07 NOTE — Progress Notes (Unsigned)
   Subjective:  Patient ID: Ethan Crosby, male    DOB: 03/02/1966  Age: 56 y.o. MRN: [id number]  CC: No chief complaint on file.   HPI Ethan Crosby presents for ***  Outpatient Medications Prior to Visit  Medication Sig Dispense Refill   Multiple Vitamin (MULTIVITAMIN) tablet Take 1 tablet by mouth daily.     olmesartan (BENICAR) 20 MG tablet Take 1 tablet (20 mg total) by mouth daily. 90 tablet 0   rosuvastatin (CRESTOR) 20 MG tablet TAKE ONE TABLET BY MOUTH ONCE DAILY 90 tablet 0   venlafaxine XR (EFFEXOR-XR) 75 MG 24 hr capsule TAKE ONE CAPSULE BY MOUTH ONCE DAILY WITH BREAKFAST 90 capsule 0   No facility-administered medications prior to visit.    ROS Review of Systems  Objective:  There were no vitals taken for this visit.  BP Readings from Last 3 Encounters:  06/09/21 (!) 144/94  01/08/20 (!) 142/82  10/31/19 134/90    Wt Readings from Last 3 Encounters:  06/09/21 220 lb (99.8 kg)  01/08/20 220 lb (99.8 kg)  10/31/19 217 lb (98.4 kg)    Physical Exam  Lab Results  Component Value Date   WBC 6.1 06/09/2021   HGB 15.1 06/09/2021   HCT 45.5 06/09/2021   PLT 213.0 06/09/2021   GLUCOSE 93 06/09/2021   CHOL 134 06/09/2021   TRIG 61.0 06/09/2021   HDL 45.50 06/09/2021   LDLDIRECT 236.0 12/30/2015   LDLCALC 76 06/09/2021   ALT 40 06/09/2021   AST 31 06/09/2021   NA 138 06/09/2021   K 4.5 06/09/2021   CL 104 06/09/2021   CREATININE 0.79 06/09/2021   BUN 17 06/09/2021   CO2 26 06/09/2021   TSH 0.88 06/09/2021   PSA 1.17 06/09/2021   HGBA1C 5.3 12/30/2015    CT RENAL STONE STUDY  Result Date: 01/13/2020 CLINICAL DATA:  Left flank and groin pain for 2 weeks. Microscopic hematuria. EXAM: CT ABDOMEN AND PELVIS WITHOUT CONTRAST TECHNIQUE: Multidetector CT imaging of the abdomen and pelvis was performed following the standard protocol without IV contrast. COMPARISON:  06/02/2016 FINDINGS: Lower chest: No acute findings. Hepatobiliary: No mass visualized  on this unenhanced exam. Gallbladder is unremarkable. No evidence of biliary ductal dilatation. Pancreas: No mass or inflammatory process visualized on this unenhanced exam. Spleen:  Within normal limits in size. Adrenals/Urinary tract: 3 mm calculus again seen in midpole of right kidney. No evidence of ureteral calculi or hydronephrosis. 6 cm fluid attenuation cyst in lower pole of right kidney shows no significant change. Unremarkable unopacified urinary bladder. Stomach/Bowel: No evidence of obstruction, inflammatory process, or abnormal fluid collections. Normal appendix visualized. Vascular/Lymphatic: No pathologically enlarged lymph nodes identified. No evidence of abdominal aortic aneurysm. Aortic atherosclerosis noted. Reproductive:  No mass or other significant abnormality. Other:  None. Musculoskeletal:  No suspicious bone lesions identified. IMPRESSION: 3 mm right renal calculus. No evidence of ureteral calculi, hydronephrosis, or other acute findings. Aortic Atherosclerosis (ICD10-I70.0). Electronically Signed   By: Danae Orleans M.D.   On: 01/13/2020 19:13    Assessment & Plan:   There are no diagnoses linked to this encounter. I am having Ethan Crosby maintain his multivitamin, rosuvastatin, olmesartan, and venlafaxine XR.  No orders of the defined types were placed in this encounter.    Follow-up: No follow-ups on file.  Sanda Linger, MD

## 2022-02-08 ENCOUNTER — Encounter: Payer: Self-pay | Admitting: Internal Medicine

## 2022-02-10 DIAGNOSIS — Z87891 Personal history of nicotine dependence: Secondary | ICD-10-CM | POA: Insufficient documentation

## 2022-02-10 DIAGNOSIS — F3341 Major depressive disorder, recurrent, in partial remission: Secondary | ICD-10-CM | POA: Insufficient documentation

## 2022-03-10 ENCOUNTER — Other Ambulatory Visit: Payer: Self-pay | Admitting: Internal Medicine

## 2022-03-10 DIAGNOSIS — E785 Hyperlipidemia, unspecified: Secondary | ICD-10-CM

## 2022-04-07 ENCOUNTER — Encounter: Payer: Self-pay | Admitting: Internal Medicine

## 2022-04-08 ENCOUNTER — Other Ambulatory Visit: Payer: Self-pay | Admitting: Internal Medicine

## 2022-04-08 DIAGNOSIS — L237 Allergic contact dermatitis due to plants, except food: Secondary | ICD-10-CM | POA: Insufficient documentation

## 2022-04-08 MED ORDER — CLOBETASOL PROPIONATE 0.05 % EX OINT: 1.0000 | TOPICAL_OINTMENT | Freq: Two times a day (BID) | CUTANEOUS | 1 refills | Status: AC

## 2022-04-08 MED ORDER — METHYLPREDNISOLONE 4 MG PO TBPK: ORAL_TABLET | ORAL | 0 refills | Status: AC

## 2022-04-08 MED ORDER — HYDROXYZINE PAMOATE 25 MG PO CAPS: 25.0000 mg | ORAL_CAPSULE | Freq: Three times a day (TID) | ORAL | 0 refills | Status: AC | PRN

## 2022-04-11 ENCOUNTER — Ambulatory Visit: Payer: BC Managed Care – PPO

## 2022-04-14 ENCOUNTER — Ambulatory Visit: Payer: BC Managed Care – PPO | Admitting: *Deleted

## 2022-04-14 ENCOUNTER — Ambulatory Visit: Payer: BC Managed Care – PPO

## 2022-05-12 ENCOUNTER — Ambulatory Visit (INDEPENDENT_AMBULATORY_CARE_PROVIDER_SITE_OTHER): Payer: BC Managed Care – PPO

## 2022-05-12 DIAGNOSIS — Z23 Encounter for immunization: Secondary | ICD-10-CM

## 2022-08-04 ENCOUNTER — Encounter: Payer: Self-pay | Admitting: Internal Medicine

## 2022-08-08 ENCOUNTER — Other Ambulatory Visit: Payer: Self-pay | Admitting: Internal Medicine

## 2022-08-08 DIAGNOSIS — F3341 Major depressive disorder, recurrent, in partial remission: Secondary | ICD-10-CM

## 2022-08-15 ENCOUNTER — Encounter: Payer: Self-pay | Admitting: Internal Medicine

## 2022-08-15 DIAGNOSIS — E785 Hyperlipidemia, unspecified: Secondary | ICD-10-CM

## 2022-08-16 MED ORDER — ROSUVASTATIN CALCIUM 20 MG PO TABS: 20.0000 mg | ORAL_TABLET | Freq: Every day | ORAL | 0 refills | Status: DC

## 2022-09-02 ENCOUNTER — Encounter: Payer: Self-pay | Admitting: Internal Medicine

## 2022-09-07 ENCOUNTER — Ambulatory Visit: Payer: BC Managed Care – PPO | Admitting: Internal Medicine

## 2022-09-07 ENCOUNTER — Encounter: Payer: Self-pay | Admitting: Internal Medicine

## 2022-09-07 VITALS — BP 128/88 | HR 90 | Temp 98.1°F | Ht 66.0 in | Wt 208.8 lb

## 2022-09-07 DIAGNOSIS — I10 Essential (primary) hypertension: Secondary | ICD-10-CM

## 2022-09-07 DIAGNOSIS — R3129 Other microscopic hematuria: Secondary | ICD-10-CM | POA: Diagnosis not present

## 2022-09-07 DIAGNOSIS — N400 Enlarged prostate without lower urinary tract symptoms: Secondary | ICD-10-CM

## 2022-09-07 DIAGNOSIS — E785 Hyperlipidemia, unspecified: Secondary | ICD-10-CM

## 2022-09-07 DIAGNOSIS — R6882 Decreased libido: Secondary | ICD-10-CM | POA: Insufficient documentation

## 2022-09-07 DIAGNOSIS — R5382 Chronic fatigue, unspecified: Secondary | ICD-10-CM | POA: Diagnosis not present

## 2022-09-07 DIAGNOSIS — N4340 Spermatocele of epididymis, unspecified: Secondary | ICD-10-CM

## 2022-09-07 DIAGNOSIS — Z0001 Encounter for general adult medical examination with abnormal findings: Secondary | ICD-10-CM | POA: Diagnosis not present

## 2022-09-07 NOTE — Progress Notes (Signed)
Subjective:  Patient ID: Ethan Crosby, male    DOB: Dec 18, 1965  Age: 57 y.o. MRN: [id number]  CC: Annual Exam, Hyperlipidemia, and Hypertension   HPI Ethan Crosby presents for a CPX and f/up ----   He complains of chronic fatigue and low libido.  He has normal erectile function.  He is active and denies chest pain, shortness of breath, diaphoresis, or edema.  Outpatient Medications Prior to Visit  Medication Sig Dispense Refill   Multiple Vitamin (MULTIVITAMIN) tablet Take 1 tablet by mouth daily.     rosuvastatin (CRESTOR) 20 MG tablet Take 1 tablet (20 mg total) by mouth daily. MUST KEEP APPOINTMENT IN [facility] FOR ADDITIONAL REFILLS 30 tablet 0   venlafaxine XR (EFFEXOR-XR) 150 MG 24 hr capsule Take 1 capsule (150 mg total) by mouth daily with breakfast. 90 capsule 0   No facility-administered medications prior to visit.    ROS Review of Systems  Constitutional:  Positive for fatigue and unexpected weight change (wt gain). Negative for appetite change, chills, diaphoresis and fever.  HENT: Negative.    Eyes: Negative.   Respiratory: Negative.  Negative for cough, chest tightness, shortness of breath and wheezing.   Cardiovascular:  Negative for chest pain, palpitations and leg swelling.  Gastrointestinal:  Negative for abdominal pain, constipation, diarrhea, nausea and vomiting.  Genitourinary:  Positive for scrotal swelling and testicular pain. Negative for decreased urine volume, difficulty urinating, dysuria, hematuria and urgency.  Musculoskeletal: Negative.  Negative for myalgias.  Skin: Negative.   Neurological: Negative.  Negative for dizziness and weakness.  Hematological:  Negative for adenopathy. Does not bruise/bleed easily.  Psychiatric/Behavioral: Negative.      Objective:  BP 128/88 (BP Location: Left Arm, Patient Position: Sitting, Cuff Size: Normal)   Pulse 90   Temp 98.1 F (36.7 C) (Oral)   Ht 5\' 6"  (1.676 m)   Wt 208 lb 12.8 oz (94.7 kg)    SpO2 98%   BMI 33.70 kg/m   BP Readings from Last 3 Encounters:  09/07/22 128/88  02/07/22 112/68  06/09/21 (!) 144/94    Wt Readings from Last 3 Encounters:  09/07/22 208 lb 12.8 oz (94.7 kg)  02/07/22 197 lb 2 oz (89.4 kg)  06/09/21 220 lb (99.8 kg)    Physical Exam Vitals reviewed.  Constitutional:      Appearance: He is not ill-appearing.  HENT:     Nose: Nose normal.     Mouth/Throat:     Mouth: Mucous membranes are moist.  Eyes:     General: No scleral icterus.    Conjunctiva/sclera: Conjunctivae normal.  Cardiovascular:     Rate and Rhythm: Normal rate and regular rhythm.     Heart sounds: Normal heart sounds, S1 normal and S2 normal. No murmur heard.    No friction rub. No gallop.     Comments: EKG- NSR, 82 bpm Flat T in V6 and I No LVH or Q waves Pulmonary:     Effort: Pulmonary effort is normal.     Breath sounds: No stridor. No wheezing, rhonchi or rales.  Abdominal:     General: Abdomen is flat.     Palpations: There is no mass.     Tenderness: There is no abdominal tenderness. There is no guarding.     Hernia: No hernia is present. There is no hernia in the left inguinal area or right inguinal area.  Genitourinary:    Pubic Area: No rash.      Penis: Normal.  Testes: Normal.     Epididymis:     Right: Normal.     Left: Enlarged. Not inflamed. Mass and tenderness present.     Prostate: Enlarged. Not tender and no nodules present.     Rectum: Normal. Guaiac result negative. No mass, tenderness, anal fissure, external hemorrhoid or internal hemorrhoid. Normal anal tone.  Musculoskeletal:     Cervical back: Neck supple.     Right lower leg: No edema.     Left lower leg: No edema.  Lymphadenopathy:     Cervical: No cervical adenopathy.     Lower Body: No right inguinal adenopathy. No left inguinal adenopathy.  Skin:    General: Skin is warm and dry.  Neurological:     General: No focal deficit present.     Mental Status: He is alert. Mental  status is at baseline.  Psychiatric:        Mood and Affect: Mood normal.        Behavior: Behavior normal.     Lab Results  Component Value Date   WBC 7.1 09/07/2022   HGB 15.6 09/07/2022   HCT 46.3 09/07/2022   PLT 205.0 09/07/2022   GLUCOSE 121 (H) 09/07/2022   CHOL 138 09/07/2022   TRIG 43.0 09/07/2022   HDL 60.00 09/07/2022   LDLDIRECT 236.0 12/30/2015   LDLCALC 69 09/07/2022   ALT 42 09/07/2022   AST 33 09/07/2022   NA 143 09/07/2022   K 5.5 No hemolysis seen (H) 09/07/2022   CL 103 09/07/2022   CREATININE 0.98 09/07/2022   BUN 11 09/07/2022   CO2 30 09/07/2022   TSH 1.37 09/07/2022   PSA 1.18 09/07/2022   HGBA1C 5.3 12/30/2015    CT RENAL STONE STUDY  Result Date: 01/13/2020 CLINICAL DATA:  Left flank and groin pain for 2 weeks. Microscopic hematuria. EXAM: CT ABDOMEN AND PELVIS WITHOUT CONTRAST TECHNIQUE: Multidetector CT imaging of the abdomen and pelvis was performed following the standard protocol without IV contrast. COMPARISON:  06/02/2016 FINDINGS: Lower chest: No acute findings. Hepatobiliary: No mass visualized on this unenhanced exam. Gallbladder is unremarkable. No evidence of biliary ductal dilatation. Pancreas: No mass or inflammatory process visualized on this unenhanced exam. Spleen:  Within normal limits in size. Adrenals/Urinary tract: 3 mm calculus again seen in midpole of right kidney. No evidence of ureteral calculi or hydronephrosis. 6 cm fluid attenuation cyst in lower pole of right kidney shows no significant change. Unremarkable unopacified urinary bladder. Stomach/Bowel: No evidence of obstruction, inflammatory process, or abnormal fluid collections. Normal appendix visualized. Vascular/Lymphatic: No pathologically enlarged lymph nodes identified. No evidence of abdominal aortic aneurysm. Aortic atherosclerosis noted. Reproductive:  No mass or other significant abnormality. Other:  None. Musculoskeletal:  No suspicious bone lesions identified.  IMPRESSION: 3 mm right renal calculus. No evidence of ureteral calculi, hydronephrosis, or other acute findings. Aortic Atherosclerosis (ICD10-I70.0). Electronically Signed   By: Marlaine Hind M.D.   On: 01/13/2020 19:13    Assessment & Plan:   [place] was seen today for annual exam, hyperlipidemia and hypertension.  Diagnoses and all orders for this visit:  Primary hypertension- His blood pressure is well controlled. -     Urinalysis, Routine w reflex microscopic; Future -     CBC with Differential/Platelet; Future -     Basic metabolic panel; Future -     EKG 12-Lead -     Basic metabolic panel -     CBC with Differential/Platelet -     Urinalysis, Routine w  reflex microscopic  Hyperlipidemia with target LDL less than 100- LDL goal achieved. Doing well on the statin  -     Lipid panel; Future -     Hepatic function panel; Future -     Hepatic function panel -     Lipid panel  Encounter for general adult medical examination with abnormal findings- Exam completed, labs reviewed, vaccines reviewed and updated, cancer screenings are up-to-date, patient education was given. -     PSA; Future -     PSA  Other microscopic hematuria -     Urinalysis, Routine w reflex microscopic; Future -     Urinalysis, Routine w reflex microscopic  Benign prostatic hyperplasia without lower urinary tract symptoms -     Urinalysis, Routine w reflex microscopic; Future -     Urinalysis, Routine w reflex microscopic  Chronic fatigue- Labs are negative for secondary causes. -     Prolactin; Future -     TSH; Future -     Testosterone Total,Free,Bio, Males; Future -     Testosterone Total,Free,Bio, Males -     TSH -     Prolactin  Low libido- Labs are negative for secondary causes. -     Prolactin; Future -     TSH; Future -     Testosterone Total,Free,Bio, Males; Future -     Testosterone Total,Free,Bio, Males -     TSH -     Prolactin  Spermatocele -     Ambulatory referral to  Urology   I am having Nikki Dom. Sobiech maintain his multivitamin, venlafaxine XR, and rosuvastatin.  No orders of the defined types were placed in this encounter.    Follow-up: Return in about 6 months (around 03/10/2023).  Scarlette Calico, MD

## 2022-09-07 NOTE — Patient Instructions (Signed)
Health Maintenance, Male Adopting a healthy lifestyle and getting preventive care are important in promoting health and wellness. Ask your health care provider about: The right schedule for you to have regular tests and exams. Things you can do on your own to prevent diseases and keep yourself healthy. What should I know about diet, weight, and exercise? Eat a healthy diet  Eat a diet that includes plenty of vegetables, fruits, low-fat dairy products, and lean protein. Do not eat a lot of foods that are high in solid fats, added sugars, or sodium. Maintain a healthy weight Body mass index (BMI) is a measurement that can be used to identify possible weight problems. It estimates body fat based on height and weight. Your health care provider can help determine your BMI and help you achieve or maintain a healthy weight. Get regular exercise Get regular exercise. This is one of the most important things you can do for your health. Most adults should: Exercise for at least 150 minutes each week. The exercise should increase your heart rate and make you sweat (moderate-intensity exercise). Do strengthening exercises at least twice a week. This is in addition to the moderate-intensity exercise. Spend less time sitting. Even light physical activity can be beneficial. Watch cholesterol and blood lipids Have your blood tested for lipids and cholesterol at 57 years of age, then have this test every 5 years. You may need to have your cholesterol levels checked more often if: Your lipid or cholesterol levels are high. You are older than 57 years of age. You are at high risk for heart disease. What should I know about cancer screening? Many types of cancers can be detected early and may often be prevented. Depending on your health history and family history, you may need to have cancer screening at various ages. This may include screening for: Colorectal cancer. Prostate cancer. Skin cancer. Lung  cancer. What should I know about heart disease, diabetes, and high blood pressure? Blood pressure and heart disease High blood pressure causes heart disease and increases the risk of stroke. This is more likely to develop in people who have high blood pressure readings or are overweight. Talk with your health care provider about your target blood pressure readings. Have your blood pressure checked: Every 3-5 years if you are 18-39 years of age. Every year if you are 40 years old or older. If you are between the ages of 65 and 75 and are a current or former smoker, ask your health care provider if you should have a one-time screening for abdominal aortic aneurysm (AAA). Diabetes Have regular diabetes screenings. This checks your fasting blood sugar level. Have the screening done: Once every three years after age 45 if you are at a normal weight and have a low risk for diabetes. More often and at a younger age if you are overweight or have a high risk for diabetes. What should I know about preventing infection? Hepatitis B If you have a higher risk for hepatitis B, you should be screened for this virus. Talk with your health care provider to find out if you are at risk for hepatitis B infection. Hepatitis C Blood testing is recommended for: Everyone born from 1945 through 1965. Anyone with known risk factors for hepatitis C. Sexually transmitted infections (STIs) You should be screened each year for STIs, including gonorrhea and chlamydia, if: You are sexually active and are younger than 57 years of age. You are older than 57 years of age and your   health care provider tells you that you are at risk for this type of infection. Your sexual activity has changed since you were last screened, and you are at increased risk for chlamydia or gonorrhea. Ask your health care provider if you are at risk. Ask your health care provider about whether you are at high risk for HIV. Your health care provider  may recommend a prescription medicine to help prevent HIV infection. If you choose to take medicine to prevent HIV, you should first get tested for HIV. You should then be tested every 3 months for as long as you are taking the medicine. Follow these instructions at home: Alcohol use Do not drink alcohol if your health care provider tells you not to drink. If you drink alcohol: Limit how much you have to 0-2 drinks a day. Know how much alcohol is in your drink. In the [place] one drink equals one 12 oz bottle of beer (355 mL), one 5 oz glass of wine (148 mL), or one 1 oz glass of hard liquor (44 mL). Lifestyle Do not use any products that contain nicotine or tobacco. These products include cigarettes, chewing tobacco, and vaping devices, such as e-cigarettes. If you need help quitting, ask your health care provider. Do not use street drugs. Do not share needles. Ask your health care provider for help if you need support or information about quitting drugs. General instructions Schedule regular health, dental, and eye exams. Stay current with your vaccines. Tell your health care provider if: You often feel depressed. You have ever been abused or do not feel safe at home. Summary Adopting a healthy lifestyle and getting preventive care are important in promoting health and wellness. Follow your health care provider's instructions about healthy diet, exercising, and getting tested or screened for diseases. Follow your health care provider's instructions on monitoring your cholesterol and blood pressure. This information is not intended to replace advice given to you by your health care provider. Make sure you discuss any questions you have with your health care provider. Document Revised: 11/09/2020 Document Reviewed: 11/09/2020 Elsevier Patient Education  2023 [employer] Inc[employer]  

## 2022-09-08 LAB — CBC WITH DIFFERENTIAL/PLATELET
Basophils Absolute: 0.1 10*3/uL (ref 0.0–0.1)
Basophils Relative: 1.2 % (ref 0.0–3.0)
Eosinophils Absolute: 0.4 10*3/uL (ref 0.0–0.7)
Eosinophils Relative: 5.2 % — ABNORMAL HIGH (ref 0.0–5.0)
HCT: 46.3 % (ref 39.0–52.0)
Hemoglobin: 15.6 g/dL (ref 13.0–17.0)
Lymphocytes Relative: 43.4 % (ref 12.0–46.0)
Lymphs Abs: 3.1 10*3/uL (ref 0.7–4.0)
MCHC: 33.7 g/dL (ref 30.0–36.0)
MCV: 86.9 fl (ref 78.0–100.0)
Monocytes Absolute: 0.6 10*3/uL (ref 0.1–1.0)
Monocytes Relative: 8.3 % (ref 3.0–12.0)
Neutro Abs: 3 10*3/uL (ref 1.4–7.7)
Neutrophils Relative %: 41.9 % — ABNORMAL LOW (ref 43.0–77.0)
Platelets: 205 10*3/uL (ref 150.0–400.0)
RBC: 5.33 Mil/uL (ref 4.22–5.81)
RDW: 13.3 % (ref 11.5–15.5)
WBC: 7.1 10*3/uL (ref 4.0–10.5)

## 2022-09-08 LAB — LIPID PANEL
Cholesterol: 138 mg/dL (ref 0–200)
HDL: 60 mg/dL (ref >39.00)
LDL Cholesterol: 69 mg/dL (ref 0–99)
NonHDL: 77.98
Total CHOL/HDL Ratio: 2
Triglycerides: 43 mg/dL (ref 0.0–149.0)
VLDL: 8.6 mg/dL (ref 0.0–40.0)

## 2022-09-08 LAB — URINALYSIS, ROUTINE W REFLEX MICROSCOPIC
Bilirubin Urine: NEGATIVE
Hgb urine dipstick: NEGATIVE
Ketones, ur: NEGATIVE
Leukocytes,Ua: NEGATIVE
Nitrite: NEGATIVE
RBC / HPF: NONE SEEN (ref 0–?)
Specific Gravity, Urine: 1.015 (ref 1.000–1.030)
Total Protein, Urine: NEGATIVE
Urine Glucose: NEGATIVE
Urobilinogen, UA: 0.2 (ref 0.0–1.0)
pH: 6.5 (ref 5.0–8.0)

## 2022-09-08 LAB — HEPATIC FUNCTION PANEL
ALT: 42 U/L (ref 0–53)
AST: 33 U/L (ref 0–37)
Albumin: 4.7 g/dL (ref 3.5–5.2)
Alkaline Phosphatase: 60 U/L (ref 39–117)
Bilirubin, Direct: 0.1 mg/dL (ref 0.0–0.3)
Total Bilirubin: 0.5 mg/dL (ref 0.2–1.2)
Total Protein: 7.3 g/dL (ref 6.0–8.3)

## 2022-09-08 LAB — PSA: PSA: 1.18 ng/mL (ref 0.10–4.00)

## 2022-09-08 LAB — BASIC METABOLIC PANEL
BUN: 11 mg/dL (ref 6–23)
CO2: 30 mEq/L (ref 19–32)
Calcium: 10.2 mg/dL (ref 8.4–10.5)
Chloride: 103 mEq/L (ref 96–112)
Creatinine, Ser: 0.98 mg/dL (ref 0.40–1.50)
GFR: 86.28 mL/min (ref >60.00)
Glucose, Bld: 121 mg/dL — ABNORMAL HIGH (ref 70–99)
Potassium: 5.5 mEq/L — ABNORMAL HIGH (ref 3.5–5.1)
Sodium: 143 mEq/L (ref 135–145)

## 2022-09-08 LAB — TSH: TSH: 1.37 u[IU]/mL (ref 0.35–5.50)

## 2022-09-09 DIAGNOSIS — N4342 Spermatocele of epididymis, multiple: Secondary | ICD-10-CM | POA: Diagnosis not present

## 2022-09-09 DIAGNOSIS — N50812 Left testicular pain: Secondary | ICD-10-CM | POA: Diagnosis not present

## 2022-09-09 LAB — TESTOSTERONE TOTAL,FREE,BIO, MALES
Albumin: 4.9 g/dL (ref 3.6–5.1)
Sex Hormone Binding: 44 nmol/L (ref 22–77)
Testosterone, Bioavailable: 70.2 ng/dL — ABNORMAL LOW (ref 110.0–575.0)
Testosterone, Free: 31.5 pg/mL — ABNORMAL LOW (ref 46.0–224.0)
Testosterone: 320 ng/dL (ref 250–827)

## 2022-09-09 LAB — PROLACTIN: Prolactin: 3.7 ng/mL (ref 2.0–18.0)

## 2022-09-23 DIAGNOSIS — N503 Cyst of epididymis: Secondary | ICD-10-CM | POA: Diagnosis not present

## 2022-09-26 DIAGNOSIS — N4342 Spermatocele of epididymis, multiple: Secondary | ICD-10-CM | POA: Diagnosis not present

## 2022-10-10 ENCOUNTER — Other Ambulatory Visit: Payer: Self-pay | Admitting: Internal Medicine

## 2022-10-10 DIAGNOSIS — E785 Hyperlipidemia, unspecified: Secondary | ICD-10-CM

## 2022-10-23 ENCOUNTER — Other Ambulatory Visit: Payer: Self-pay | Admitting: Internal Medicine

## 2022-10-23 DIAGNOSIS — B002 Herpesviral gingivostomatitis and pharyngotonsillitis: Secondary | ICD-10-CM

## 2022-11-01 ENCOUNTER — Other Ambulatory Visit: Payer: Self-pay | Admitting: Internal Medicine

## 2022-11-01 DIAGNOSIS — F3341 Major depressive disorder, recurrent, in partial remission: Secondary | ICD-10-CM

## 2022-11-08 ENCOUNTER — Encounter: Payer: Self-pay | Admitting: Internal Medicine

## 2022-11-15 ENCOUNTER — Telehealth: Payer: Self-pay | Admitting: Internal Medicine

## 2022-11-15 ENCOUNTER — Encounter: Payer: Self-pay | Admitting: Internal Medicine

## 2022-11-15 ENCOUNTER — Ambulatory Visit: Payer: BC Managed Care – PPO | Admitting: Internal Medicine

## 2022-11-15 VITALS — BP 138/78 | HR 82 | Temp 98.0°F | Resp 16 | Ht 66.0 in | Wt 212.0 lb

## 2022-11-15 DIAGNOSIS — N5089 Other specified disorders of the male genital organs: Secondary | ICD-10-CM | POA: Diagnosis not present

## 2022-11-15 DIAGNOSIS — E785 Hyperlipidemia, unspecified: Secondary | ICD-10-CM | POA: Diagnosis not present

## 2022-11-15 MED ORDER — ROSUVASTATIN CALCIUM 20 MG PO TABS: 20.0000 mg | ORAL_TABLET | Freq: Every day | ORAL | 1 refills | Status: DC

## 2022-11-15 NOTE — Progress Notes (Signed)
Subjective:  Patient ID: Ethan Crosby, male    DOB: 09-16-1965  Age: 57 y.o. MRN: [id number]  CC: Testicle Pain and Hyperlipidemia   HPI RICARD PENDERGRAPH presents for f/up --  He is status post removal of a left spermatocele.  The postoperative course was complicated by a wound infection.  He tells me that all of that has healed up but now for the last week he has noticed a painful, hard nodule behind his left testicle.  Outpatient Medications Prior to Visit  Medication Sig Dispense Refill   Multiple Vitamin (MULTIVITAMIN) tablet Take 1 tablet by mouth daily.     venlafaxine XR (EFFEXOR-XR) 150 MG 24 hr capsule TAKE ONE CAPSULE BY MOUTH DAILY WITH BREAKFAST 90 capsule 1   rosuvastatin (CRESTOR) 20 MG tablet Take 1 tablet (20 mg total) by mouth daily. MUST KEEP APPOINTMENT IN [facility] FOR ADDITIONAL REFILLS 90 tablet 1   No facility-administered medications prior to visit.    ROS Review of Systems  Constitutional: Negative.  Negative for diaphoresis and fatigue.  HENT: Negative.    Eyes: Negative.   Respiratory: Negative.  Negative for cough and shortness of breath.   Cardiovascular:  Negative for chest pain, palpitations and leg swelling.  Gastrointestinal:  Negative for abdominal pain, constipation, diarrhea, nausea and vomiting.  Endocrine: Negative.   Genitourinary:  Positive for testicular pain. Negative for difficulty urinating, dysuria, flank pain, genital sores, hematuria, penile discharge, penile pain, scrotal swelling and urgency.  Skin: Negative.  Negative for rash.  Neurological: Negative.  Negative for dizziness and weakness.  Hematological:  Negative for adenopathy. Does not bruise/bleed easily.  Psychiatric/Behavioral: Negative.      Objective:  BP 138/78 (BP Location: Left Arm, Patient Position: Sitting, Cuff Size: Large)   Pulse 82   Temp 98 F (36.7 C) (Oral)   Resp 16   Ht 5\' 6"  (1.676 m)   Wt 212 lb (96.2 kg)   SpO2 98%   BMI 34.22 kg/m   BP  Readings from Last 3 Encounters:  11/15/22 138/78  09/07/22 128/88  02/07/22 112/68    Wt Readings from Last 3 Encounters:  11/15/22 212 lb (96.2 kg)  09/07/22 208 lb 12.8 oz (94.7 kg)  02/07/22 197 lb 2 oz (89.4 kg)    Physical Exam Vitals reviewed.  HENT:     Nose: Nose normal.     Mouth/Throat:     Mouth: Mucous membranes are moist.  Eyes:     General: No scleral icterus.    Conjunctiva/sclera: Conjunctivae normal.  Cardiovascular:     Rate and Rhythm: Normal rate and regular rhythm.     Heart sounds: No murmur heard. Pulmonary:     Effort: Pulmonary effort is normal.     Breath sounds: No stridor. No wheezing, rhonchi or rales.  Abdominal:     General: Abdomen is flat.     Palpations: There is no mass.     Tenderness: There is no abdominal tenderness. There is no guarding.     Hernia: No hernia is present. There is no hernia in the left inguinal area or right inguinal area.  Genitourinary:    Pubic Area: No rash.      Penis: Normal and circumcised.      Testes:        Right: Mass, tenderness or swelling not present.        Left: Mass present. Tenderness, swelling, testicular hydrocele or varicocele not present.     Epididymis:  Right: Normal. Not inflamed or enlarged. No mass or tenderness.     Left: Normal. Not enlarged. No mass or tenderness.       Comments: There is a 4 mm, hard, freely mobile mass behind the left mid testicle. Musculoskeletal:        General: Normal range of motion.     Cervical back: Neck supple.     Right lower leg: No edema.     Left lower leg: No edema.  Lymphadenopathy:     Cervical: No cervical adenopathy.     Lower Body: No right inguinal adenopathy. No left inguinal adenopathy.  Skin:    General: Skin is warm and dry.     Coloration: Skin is not pale.  Neurological:     General: No focal deficit present.     Mental Status: He is alert. Mental status is at baseline.  Psychiatric:        Mood and Affect: Mood normal.         Behavior: Behavior normal.     Lab Results  Component Value Date   WBC 7.1 09/07/2022   HGB 15.6 09/07/2022   HCT 46.3 09/07/2022   PLT 205.0 09/07/2022   GLUCOSE 121 (H) 09/07/2022   CHOL 138 09/07/2022   TRIG 43.0 09/07/2022   HDL 60.00 09/07/2022   LDLDIRECT 236.0 12/30/2015   LDLCALC 69 09/07/2022   ALT 42 09/07/2022   AST 33 09/07/2022   NA 143 09/07/2022   K 5.5 No hemolysis seen (H) 09/07/2022   CL 103 09/07/2022   CREATININE 0.98 09/07/2022   BUN 11 09/07/2022   CO2 30 09/07/2022   TSH 1.37 09/07/2022   PSA 1.18 09/07/2022   HGBA1C 5.3 12/30/2015    CT RENAL STONE STUDY  Result Date: 01/13/2020 CLINICAL DATA:  Left flank and groin pain for 2 weeks. Microscopic hematuria. EXAM: CT ABDOMEN AND PELVIS WITHOUT CONTRAST TECHNIQUE: Multidetector CT imaging of the abdomen and pelvis was performed following the standard protocol without IV contrast. COMPARISON:  06/02/2016 FINDINGS: Lower chest: No acute findings. Hepatobiliary: No mass visualized on this unenhanced exam. Gallbladder is unremarkable. No evidence of biliary ductal dilatation. Pancreas: No mass or inflammatory process visualized on this unenhanced exam. Spleen:  Within normal limits in size. Adrenals/Urinary tract: 3 mm calculus again seen in midpole of right kidney. No evidence of ureteral calculi or hydronephrosis. 6 cm fluid attenuation cyst in lower pole of right kidney shows no significant change. Unremarkable unopacified urinary bladder. Stomach/Bowel: No evidence of obstruction, inflammatory process, or abnormal fluid collections. Normal appendix visualized. Vascular/Lymphatic: No pathologically enlarged lymph nodes identified. No evidence of abdominal aortic aneurysm. Aortic atherosclerosis noted. Reproductive:  No mass or other significant abnormality. Other:  None. Musculoskeletal:  No suspicious bone lesions identified. IMPRESSION: 3 mm right renal calculus. No evidence of ureteral calculi, hydronephrosis, or  other acute findings. Aortic Atherosclerosis (ICD10-I70.0). Electronically Signed   By: Danae Orleans M.D.   On: 01/13/2020 19:13    Assessment & Plan:   Testicular mass- Will evaluate for malignancy with an ultrasound. -     US SCROTUM W/DOPPLER; Future  Hyperlipidemia with target LDL less than 100 - LDL goal achieved. Doing well on the statin  -     Rosuvastatin Calcium; Take 1 tablet (20 mg total) by mouth daily. MUST KEEP APPOINTMENT IN [facility] FOR ADDITIONAL REFILLS  Dispense: 90 tablet; Refill: 1     Follow-up: No follow-ups on file.  Sanda Linger, MD

## 2022-11-15 NOTE — Telephone Encounter (Signed)
[facility] needs to know if patient needs doppler for the scrotum imaging. It will change how the appointment is scheduled. Please call back at [phone], option 1, then option 5

## 2022-11-18 ENCOUNTER — Ambulatory Visit
Admission: RE | Admit: 2022-11-18 | Discharge: 2022-11-18 | Disposition: A | Discharge status: Home / Self Care | Payer: BC Managed Care – PPO | Source: Ambulatory Visit | Attending: Internal Medicine | Admitting: Internal Medicine

## 2022-11-18 DIAGNOSIS — N5089 Other specified disorders of the male genital organs: Secondary | ICD-10-CM

## 2022-11-18 DIAGNOSIS — L729 Follicular cyst of the skin and subcutaneous tissue, unspecified: Secondary | ICD-10-CM | POA: Diagnosis not present

## 2022-11-30 ENCOUNTER — Ambulatory Visit: Payer: BC Managed Care – PPO | Admitting: Internal Medicine

## 2023-02-03 ENCOUNTER — Encounter: Payer: Self-pay | Admitting: *Deleted

## 2023-02-08 ENCOUNTER — Encounter: Payer: Self-pay | Admitting: Internal Medicine

## 2023-02-14 ENCOUNTER — Ambulatory Visit: Payer: BC Managed Care – PPO | Admitting: Internal Medicine

## 2023-04-19 ENCOUNTER — Other Ambulatory Visit: Payer: Self-pay | Admitting: Internal Medicine

## 2023-04-19 DIAGNOSIS — F3341 Major depressive disorder, recurrent, in partial remission: Secondary | ICD-10-CM

## 2023-05-01 ENCOUNTER — Encounter: Payer: Self-pay | Admitting: Internal Medicine

## 2023-05-11 ENCOUNTER — Encounter: Payer: Self-pay | Admitting: Internal Medicine

## 2023-05-11 ENCOUNTER — Ambulatory Visit: Payer: BC Managed Care – PPO | Admitting: Internal Medicine

## 2023-05-11 VITALS — BP 152/104 | HR 85 | Temp 98.5°F | Resp 16 | Ht 66.0 in | Wt 225.6 lb

## 2023-05-11 DIAGNOSIS — R0789 Other chest pain: Secondary | ICD-10-CM | POA: Insufficient documentation

## 2023-05-11 DIAGNOSIS — R079 Chest pain, unspecified: Secondary | ICD-10-CM

## 2023-05-11 DIAGNOSIS — I10 Essential (primary) hypertension: Secondary | ICD-10-CM

## 2023-05-11 DIAGNOSIS — E66812 Obesity, class 2: Secondary | ICD-10-CM | POA: Insufficient documentation

## 2023-05-11 DIAGNOSIS — Z6837 Body mass index (BMI) 37.0-37.9, adult: Secondary | ICD-10-CM | POA: Insufficient documentation

## 2023-05-11 DIAGNOSIS — R0989 Other specified symptoms and signs involving the circulatory and respiratory systems: Secondary | ICD-10-CM | POA: Diagnosis not present

## 2023-05-11 LAB — BASIC METABOLIC PANEL
BUN: 11 mg/dL (ref 6–23)
CO2: 27 meq/L (ref 19–32)
Calcium: 9.6 mg/dL (ref 8.4–10.5)
Chloride: 101 meq/L (ref 96–112)
Creatinine, Ser: 0.88 mg/dL (ref 0.40–1.50)
GFR: 95.77 mL/min (ref >60.00)
Glucose, Bld: 96 mg/dL (ref 70–99)
Potassium: 4.4 meq/L (ref 3.5–5.1)
Sodium: 137 meq/L (ref 135–145)

## 2023-05-11 LAB — CBC WITH DIFFERENTIAL/PLATELET
Basophils Absolute: 0.1 10*3/uL (ref 0.0–0.1)
Basophils Relative: 1.4 % (ref 0.0–3.0)
Eosinophils Absolute: 0.6 10*3/uL (ref 0.0–0.7)
Eosinophils Relative: 7.8 % — ABNORMAL HIGH (ref 0.0–5.0)
HCT: 46.5 % (ref 39.0–52.0)
Hemoglobin: 15.5 g/dL (ref 13.0–17.0)
Lymphocytes Relative: 38.6 % (ref 12.0–46.0)
Lymphs Abs: 3.2 10*3/uL (ref 0.7–4.0)
MCHC: 33.4 g/dL (ref 30.0–36.0)
MCV: 87.2 fL (ref 78.0–100.0)
Monocytes Absolute: 0.7 10*3/uL (ref 0.1–1.0)
Monocytes Relative: 8.4 % (ref 3.0–12.0)
Neutro Abs: 3.6 10*3/uL (ref 1.4–7.7)
Neutrophils Relative %: 43.8 % (ref 43.0–77.0)
Platelets: 252 10*3/uL (ref 150.0–400.0)
RBC: 5.34 Mil/uL (ref 4.22–5.81)
RDW: 13.3 % (ref 11.5–15.5)
WBC: 8.3 10*3/uL (ref 4.0–10.5)

## 2023-05-11 LAB — URINALYSIS, ROUTINE W REFLEX MICROSCOPIC
Bilirubin Urine: NEGATIVE
Hgb urine dipstick: NEGATIVE
Ketones, ur: NEGATIVE
Leukocytes,Ua: NEGATIVE
Nitrite: NEGATIVE
RBC / HPF: NONE SEEN (ref 0–?)
Specific Gravity, Urine: 1.01 (ref 1.000–1.030)
Total Protein, Urine: NEGATIVE
Urine Glucose: NEGATIVE
Urobilinogen, UA: 0.2 (ref 0.0–1.0)
pH: 6.5 (ref 5.0–8.0)

## 2023-05-11 LAB — TROPONIN I (HIGH SENSITIVITY): High Sens Troponin I: 8 ng/L (ref 2–17)

## 2023-05-11 LAB — BRAIN NATRIURETIC PEPTIDE: Pro B Natriuretic peptide (BNP): 30 pg/mL (ref 0.0–100.0)

## 2023-05-11 MED ORDER — SEMAGLUTIDE-WEIGHT MANAGEMENT 2.4 MG/0.75ML ~~LOC~~ SOAJ: 2.4000 mg | SUBCUTANEOUS | 0 refills | Status: AC

## 2023-05-11 MED ORDER — SEMAGLUTIDE-WEIGHT MANAGEMENT 0.25 MG/0.5ML ~~LOC~~ SOAJ: 0.2500 mg | SUBCUTANEOUS | 0 refills | Status: AC

## 2023-05-11 MED ORDER — ZEPBOUND 2.5 MG/0.5ML ~~LOC~~ SOAJ: 2.5000 mg | SUBCUTANEOUS | 0 refills | Status: DC

## 2023-05-11 MED ORDER — SEMAGLUTIDE-WEIGHT MANAGEMENT 1.7 MG/0.75ML ~~LOC~~ SOAJ: 1.7000 mg | SUBCUTANEOUS | 0 refills | Status: AC

## 2023-05-11 MED ORDER — OLMESARTAN MEDOXOMIL 20 MG PO TABS: 20.0000 mg | ORAL_TABLET | Freq: Every day | ORAL | 0 refills | Status: DC

## 2023-05-11 MED ORDER — SEMAGLUTIDE-WEIGHT MANAGEMENT 0.5 MG/0.5ML ~~LOC~~ SOAJ: 0.5000 mg | SUBCUTANEOUS | 0 refills | Status: AC

## 2023-05-11 MED ORDER — INDAPAMIDE 1.25 MG PO TABS: 1.2500 mg | ORAL_TABLET | Freq: Every day | ORAL | 0 refills | Status: DC

## 2023-05-11 MED ORDER — SEMAGLUTIDE-WEIGHT MANAGEMENT 1 MG/0.5ML ~~LOC~~ SOAJ: 1.0000 mg | SUBCUTANEOUS | 0 refills | Status: AC

## 2023-05-11 NOTE — Patient Instructions (Signed)
Hypertension, Adult High blood pressure (hypertension) is when the force of blood pumping through the arteries is too strong. The arteries are the blood vessels that carry blood from the heart throughout the body. Hypertension forces the heart to work harder to pump blood and may cause arteries to become narrow or stiff. Untreated or uncontrolled hypertension can lead to a heart attack, heart failure, a stroke, kidney disease, and other problems. A blood pressure reading consists of a higher number over a lower number. Ideally, your blood pressure should be below 120/80. The first ("top") number is called the systolic pressure. It is a measure of the pressure in your arteries as your heart beats. The second ("bottom") number is called the diastolic pressure. It is a measure of the pressure in your arteries as the heart relaxes. What are the causes? The exact cause of this condition is not known. There are some conditions that result in high blood pressure. What increases the risk? Certain factors may make you more likely to develop high blood pressure. Some of these risk factors are under your control, including: Smoking. Not getting enough exercise or physical activity. Being overweight. Having too much fat, sugar, calories, or salt (sodium) in your diet. Drinking too much alcohol. Other risk factors include: Having a personal history of heart disease, diabetes, high cholesterol, or kidney disease. Stress. Having a family history of high blood pressure and high cholesterol. Having obstructive sleep apnea. Age. The risk increases with age. What are the signs or symptoms? High blood pressure may not cause symptoms. Very high blood pressure (hypertensive crisis) may cause: Headache. Fast or irregular heartbeats (palpitations). Shortness of breath. Nosebleed. Nausea and vomiting. Vision changes. Severe chest pain, dizziness, and seizures. How is this diagnosed? This condition is diagnosed by  measuring your blood pressure while you are seated, with your arm resting on a flat surface, your legs uncrossed, and your feet flat on the floor. The cuff of the blood pressure monitor will be placed directly against the skin of your upper arm at the level of your heart. Blood pressure should be measured at least twice using the same arm. Certain conditions can cause a difference in blood pressure between your right and left arms. If you have a high blood pressure reading during one visit or you have normal blood pressure with other risk factors, you may be asked to: Return on a different day to have your blood pressure checked again. Monitor your blood pressure at home for 1 week or longer. If you are diagnosed with hypertension, you may have other blood or imaging tests to help your health care provider understand your overall risk for other conditions. How is this treated? This condition is treated by making healthy lifestyle changes, such as eating healthy foods, exercising more, and reducing your alcohol intake. You may be referred for counseling on a healthy diet and physical activity. Your health care provider may prescribe medicine if lifestyle changes are not enough to get your blood pressure under control and if: Your systolic blood pressure is above 130. Your diastolic blood pressure is above 80. Your personal target blood pressure may vary depending on your medical conditions, your age, and other factors. Follow these instructions at home: Eating and drinking  Eat a diet that is high in fiber and potassium, and low in sodium, added sugar, and fat. An example of this eating plan is called the DASH diet. DASH stands for Dietary Approaches to Stop Hypertension. To eat this way: Eat   plenty of fresh fruits and vegetables. Try to fill one half of your plate at each meal with fruits and vegetables. Eat whole grains, such as whole-wheat pasta, brown rice, or whole-grain bread. Fill about one  fourth of your plate with whole grains. Eat or drink low-fat dairy products, such as skim milk or low-fat yogurt. Avoid fatty cuts of meat, processed or cured meats, and poultry with skin. Fill about one fourth of your plate with lean proteins, such as fish, chicken without skin, beans, eggs, or tofu. Avoid pre-made and processed foods. These tend to be higher in sodium, added sugar, and fat. Reduce your daily sodium intake. Many people with hypertension should eat less than 1,500 mg of sodium a day. Do not drink alcohol if: Your health care provider tells you not to drink. You are pregnant, may be pregnant, or are planning to become pregnant. If you drink alcohol: Limit how much you have to: 0-1 drink a day for women. 0-2 drinks a day for men. Know how much alcohol is in your drink. In the [place] one drink equals one 12 oz bottle of beer (355 mL), one 5 oz glass of wine (148 mL), or one 1 oz glass of hard liquor (44 mL). Lifestyle  Work with your health care provider to maintain a healthy body weight or to lose weight. Ask what an ideal weight is for you. Get at least 30 minutes of exercise that causes your heart to beat faster (aerobic exercise) most days of the week. Activities may include walking, swimming, or biking. Include exercise to strengthen your muscles (resistance exercise), such as Pilates or lifting weights, as part of your weekly exercise routine. Try to do these types of exercises for 30 minutes at least 3 days a week. Do not use any products that contain nicotine or tobacco. These products include cigarettes, chewing tobacco, and vaping devices, such as e-cigarettes. If you need help quitting, ask your health care provider. Monitor your blood pressure at home as told by your health care provider. Keep all follow-up visits. This is important. Medicines Take over-the-counter and prescription medicines only as told by your health care provider. Follow directions carefully. Blood  pressure medicines must be taken as prescribed. Do not skip doses of blood pressure medicine. Doing this puts you at risk for problems and can make the medicine less effective. Ask your health care provider about side effects or reactions to medicines that you should watch for. Contact a health care provider if you: Think you are having a reaction to a medicine you are taking. Have headaches that keep coming back (recurring). Feel dizzy. Have swelling in your ankles. Have trouble with your vision. Get help right away if you: Develop a severe headache or confusion. Have unusual weakness or numbness. Feel faint. Have severe pain in your chest or abdomen. Vomit repeatedly. Have trouble breathing. These symptoms may be an emergency. Get help right away. Call 911. Do not wait to see if the symptoms will go away. Do not drive yourself to the hospital. Summary Hypertension is when the force of blood pumping through your arteries is too strong. If this condition is not controlled, it may put you at risk for serious complications. Your personal target blood pressure may vary depending on your medical conditions, your age, and other factors. For most people, a normal blood pressure is less than 120/80. Hypertension is treated with lifestyle changes, medicines, or a combination of both. Lifestyle changes include losing weight, eating a healthy,   low-sodium diet, exercising more, and limiting alcohol. This information is not intended to replace advice given to you by your health care provider. Make sure you discuss any questions you have with your health care provider. Document Revised: 04/27/2021 Document Reviewed: 04/27/2021 Elsevier Patient Education  2024 Elsevier Inc.  

## 2023-05-11 NOTE — Progress Notes (Signed)
Subjective:  Patient ID: Ethan Crosby, male    DOB: [birth year]  Age: 57 y.o. MRN: [id number]  CC: Hypertension   HPI Ethan Crosby presents for f/up ---  Discussed the use of AI scribe software for clinical note transcription with the patient, who gave verbal consent to proceed.  History of Present Illness   The patient, with a history of hypertension, presents with intermittent chest tightness experienced during exertion such as walking the dogs or mowing the yard. This symptom was first noticed approximately six months ago. The patient denies associated symptoms such as shortness of breath, cold sweats, headache, or blurred vision.  The patient also reports a significant weight gain since quitting smoking, currently weighing 225 pounds at a height of 5'5". Despite efforts, the patient has been unable to lose the weight. 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  Previously, the patient was on medication for high blood pressure, but it was discontinued when their blood pressure dropped significantly. The patient denies any current swelling in their legs or feet.  The patient had consumed coffee prior to the appointment.  He is using nicotine mints.         Outpatient Medications Prior to Visit  Medication Sig Dispense Refill   Multiple Vitamin (MULTIVITAMIN) tablet Take 1 tablet by mouth daily.     rosuvastatin (CRESTOR) 20 MG tablet Take 1 tablet (20 mg total) by mouth daily. MUST KEEP APPOINTMENT IN [facility] FOR ADDITIONAL REFILLS 90 tablet 1   venlafaxine XR (EFFEXOR-XR) 150 MG 24 hr capsule TAKE ONE CAPSULE BY MOUTH DAILY WITH BREAKFAST 90 capsule 0   No facility-administered medications prior to visit.    ROS Review of Systems  Constitutional:  Positive for fatigue and unexpected weight change (wt gain). Negative for appetite change, diaphoresis and fever.  Eyes:  Negative.  Negative for visual disturbance.  Respiratory:  Positive for chest tightness. Negative for cough, shortness of breath and wheezing.   Cardiovascular:  Negative for chest pain, palpitations and leg swelling.  Gastrointestinal:  Negative for abdominal pain, diarrhea, nausea and vomiting.  Endocrine: Negative.   Genitourinary: Negative.  Negative for difficulty urinating and hematuria.  Musculoskeletal:  Positive for arthralgias and gait problem. Negative for joint swelling and myalgias.  Skin: Negative.   Neurological:  Negative for dizziness, weakness and headaches.  Hematological:  Negative for adenopathy. Does not bruise/bleed easily.  Psychiatric/Behavioral: Negative.      Objective:  BP (!) 152/104 (BP Location: Right Arm, Patient Position: Sitting, Cuff Size: Large)   Pulse 85   Temp 98.5 F (36.9 C) (Oral)   Resp 16   Ht 5\' 6"  (1.676 m)   Wt 225 lb 9.6 oz (102.3 kg)   SpO2 95%   BMI 36.41 kg/m   BP Readings from Last 3 Encounters:  05/11/23 (!) 152/104  11/15/22 138/78  09/07/22 128/88    Wt Readings from Last 3 Encounters:  05/11/23 225 lb 9.6 oz (102.3 kg)  11/15/22 212 lb (96.2 kg)  09/07/22 208 lb 12.8 oz (94.7 kg)    Physical Exam Vitals reviewed.  Constitutional:      Appearance: Normal appearance.  HENT:     Mouth/Throat:     Mouth: Mucous membranes are moist.  Eyes:     General: No scleral icterus.    Conjunctiva/sclera: Conjunctivae normal.  Cardiovascular:     Rate and Rhythm: Normal rate and regular rhythm.  Heart sounds: Normal heart sounds, S1 normal and S2 normal. No murmur heard.    Comments: EKG - NSR, 80 bpm T wave changes in I, V5, V6 No LVH or Q waves Pulmonary:     Effort: Pulmonary effort is normal.     Breath sounds: No stridor. No wheezing, rhonchi or rales.  Abdominal:     General: Abdomen is protuberant. Bowel sounds are normal. There is no distension.     Palpations: Abdomen is soft. There is no hepatomegaly,  splenomegaly or mass.     Tenderness: There is no abdominal tenderness.  Musculoskeletal:     Cervical back: Neck supple.     Right lower leg: No edema.     Left lower leg: No edema.  Lymphadenopathy:     Cervical: No cervical adenopathy.  Skin:    General: Skin is warm and dry.  Neurological:     General: No focal deficit present.     Mental Status: He is alert and oriented to person, place, and time.  Psychiatric:        Mood and Affect: Mood normal.        Behavior: Behavior normal.     Lab Results  Component Value Date   WBC 8.3 05/11/2023   HGB 15.5 05/11/2023   HCT 46.5 05/11/2023   PLT 252.0 05/11/2023   GLUCOSE 96 05/11/2023   CHOL 138 09/07/2022   TRIG 43.0 09/07/2022   HDL 60.00 09/07/2022   LDLDIRECT 236.0 12/30/2015   LDLCALC 69 09/07/2022   ALT 42 09/07/2022   AST 33 09/07/2022   NA 137 05/11/2023   K 4.4 05/11/2023   CL 101 05/11/2023   CREATININE 0.88 05/11/2023   BUN 11 05/11/2023   CO2 27 05/11/2023   TSH 1.37 09/07/2022   PSA 1.18 09/07/2022   HGBA1C 5.3 12/30/2015    US SCROTUM W/DOPPLER  Result Date: 11/20/2022 CLINICAL DATA:  Left scrotal lump. EXAM: ULTRASOUND OF SCROTUM TECHNIQUE: Complete ultrasound examination of the testicles, epididymis, and other scrotal structures was performed. COMPARISON:  10/06/2015 FINDINGS: The right testis measured 4.6 x 3.1 x 2.3 cm, and the left testis measured 4.4 x 3.4 x 2.1 cm. The palpable lump on the left corresponding to a cyst of the tunica albuginea that measures 0.5 x 0.3 cm. No testicular lesions are seen. The testes demonstrated blood flow with Doppler. There was an unremarkable appearance of the epididymides. No fluid collections or masses were seen. IMPRESSION: The palpable lump is a cyst of the tunica albuginea on the left. Otherwise unremarkable examination. Electronically Signed   By: Layla Maw M.D.   On: 11/20/2022 21:41    Assessment & Plan:   Chest tightness- EKG is negative for  ischemia.  Labs are reassuring.  There is a 10 mm discrepancy in the systolic blood pressure in the upper extremities.  I recommended a CT angio to evaluate for thoracic aortic dissection. -     EKG 12-Lead -     Brain natriuretic peptide; Future -     Troponin I (High Sensitivity); Future -     CT ANGIO CHEST AORTA W/ & OR WO/CM & GATING ([place] ONLY); Future  Primary hypertension- His blood pressure is not at goal.  I have asked him to stop using the nicotine and to start indapamide and olmesartan. -     Basic metabolic panel; Future -     Urinalysis, Routine w reflex microscopic; Future -     CBC with Differential/Platelet;  Future -     Olmesartan Medoxomil; Take 1 tablet (20 mg total) by mouth daily.  Dispense: 90 tablet; Refill: 0 -     Indapamide; Take 1 tablet (1.25 mg total) by mouth daily.  Dispense: 90 tablet; Refill: 0  Unequal blood pressure in upper extremities -     CT ANGIO CHEST AORTA W/ & OR WO/CM & GATING ([place] ONLY); Future  Class 2 severe obesity due to excess calories with serious comorbidity and body mass index (BMI) of 37.0 to 37.9 in adult (HCC) -     Zepbound; Inject 2.5 mg into the skin once a week.  Dispense: 4 mL; Refill: 0     Follow-up: Return in about 6 weeks (around 06/22/2023).  Sanda Linger, MD

## 2023-05-12 ENCOUNTER — Encounter: Payer: Self-pay | Admitting: Internal Medicine

## 2023-05-12 ENCOUNTER — Telehealth: Payer: Self-pay

## 2023-05-12 NOTE — Telephone Encounter (Signed)
Has anybody received PA in regards to his Wegovy ?

## 2023-05-14 DIAGNOSIS — R079 Chest pain, unspecified: Secondary | ICD-10-CM | POA: Insufficient documentation

## 2023-05-14 NOTE — Addendum Note (Signed)
Addended by: Etta Grandchild on: 05/14/2023 01:59 PM   Modules accepted: Orders

## 2023-05-15 ENCOUNTER — Telehealth: Payer: Self-pay

## 2023-05-15 ENCOUNTER — Other Ambulatory Visit (HOSPITAL_COMMUNITY): Payer: Self-pay

## 2023-05-15 ENCOUNTER — Other Ambulatory Visit: Payer: BC Managed Care – PPO

## 2023-05-15 NOTE — Telephone Encounter (Signed)
Pharmacy Patient Advocate Encounter   Received notification from Pt Calls Messages that prior authorization for Wegovy 0.25mg /0.69ml is required/requested.   Insurance verification completed.   The patient is insured through [facility] .   Per test claim: PA required; PA started via CoverMyMeds. KEY  [id number] . Waiting for clinical questions to populate.

## 2023-05-15 NOTE — Telephone Encounter (Signed)
 Clinical questions answered and PA submitted

## 2023-05-15 NOTE — Telephone Encounter (Signed)
Pharmacy Patient Advocate Encounter  Received notification from [facility] that Prior Authorization for Wegovy 0.25mg /0.22ml has been DENIED.  Full denial letter will be uploaded to the media tab. See denial reason below.   PA #/Case ID/Reference #: [id number]

## 2023-05-16 NOTE — Telephone Encounter (Signed)
Before I advise him that his medication has been denied, are we going to try something else ?

## 2023-05-26 ENCOUNTER — Other Ambulatory Visit (HOSPITAL_COMMUNITY): Payer: Self-pay | Admitting: *Deleted

## 2023-05-26 ENCOUNTER — Encounter (HOSPITAL_COMMUNITY): Payer: Self-pay

## 2023-05-26 MED ORDER — METOPROLOL TARTRATE 100 MG PO TABS
ORAL_TABLET | ORAL | 0 refills | Status: DC
Start: 2023-05-26 — End: 2024-03-19

## 2023-05-30 ENCOUNTER — Ambulatory Visit (HOSPITAL_COMMUNITY)
Admission: RE | Admit: 2023-05-30 | Discharge: 2023-05-30 | Disposition: A | Discharge status: Home / Self Care | Payer: BC Managed Care – PPO | Source: Ambulatory Visit | Attending: Internal Medicine | Admitting: Internal Medicine

## 2023-05-30 DIAGNOSIS — R072 Precordial pain: Secondary | ICD-10-CM | POA: Diagnosis not present

## 2023-05-30 DIAGNOSIS — R0989 Other specified symptoms and signs involving the circulatory and respiratory systems: Secondary | ICD-10-CM | POA: Diagnosis not present

## 2023-05-30 DIAGNOSIS — R0789 Other chest pain: Secondary | ICD-10-CM | POA: Diagnosis not present

## 2023-05-30 DIAGNOSIS — R079 Chest pain, unspecified: Secondary | ICD-10-CM | POA: Insufficient documentation

## 2023-05-30 MED ORDER — NITROGLYCERIN 0.4 MG SL SUBL
0.8000 mg | SUBLINGUAL_TABLET | SUBLINGUAL | Status: DC | PRN
  Administered 2023-05-30: 0.8 mg via SUBLINGUAL

## 2023-05-30 MED ORDER — NITROGLYCERIN 0.4 MG SL SUBL
SUBLINGUAL_TABLET | SUBLINGUAL | Status: AC
Start: 2023-05-30 — End: ?
  Filled 2023-05-30: qty 2

## 2023-05-30 MED ORDER — SODIUM CHLORIDE 0.9 % IV BOLUS
500.0000 mL | Freq: Once | INTRAVENOUS | Status: AC
  Administered 2023-05-30: 500 mL via INTRAVENOUS

## 2023-05-30 MED ORDER — IOHEXOL 350 MG/ML SOLN
95.0000 mL | Freq: Once | INTRAVENOUS | Status: AC | PRN
  Administered 2023-05-30: 95 mL via INTRAVENOUS

## 2023-07-17 ENCOUNTER — Other Ambulatory Visit: Payer: Self-pay | Admitting: Internal Medicine

## 2023-07-17 DIAGNOSIS — F3341 Major depressive disorder, recurrent, in partial remission: Secondary | ICD-10-CM

## 2023-07-30 ENCOUNTER — Encounter: Payer: Self-pay | Admitting: Internal Medicine

## 2023-08-11 ENCOUNTER — Other Ambulatory Visit: Payer: Self-pay | Admitting: Internal Medicine

## 2023-08-11 DIAGNOSIS — E785 Hyperlipidemia, unspecified: Secondary | ICD-10-CM

## 2023-08-11 DIAGNOSIS — I10 Essential (primary) hypertension: Secondary | ICD-10-CM

## 2023-09-12 ENCOUNTER — Encounter: Payer: Self-pay | Admitting: Internal Medicine

## 2023-09-14 ENCOUNTER — Other Ambulatory Visit: Payer: Self-pay | Admitting: Internal Medicine

## 2023-09-14 DIAGNOSIS — E66812 Obesity, class 2: Secondary | ICD-10-CM

## 2023-09-14 MED ORDER — NALTREXONE HCL 50 MG PO TABS
50.0000 mg | ORAL_TABLET | Freq: Every day | ORAL | 0 refills | Status: DC
Start: 2023-09-14 — End: 2024-04-18

## 2023-09-16 ENCOUNTER — Other Ambulatory Visit: Payer: Self-pay | Admitting: Internal Medicine

## 2023-09-16 DIAGNOSIS — I10 Essential (primary) hypertension: Secondary | ICD-10-CM

## 2023-10-13 DIAGNOSIS — L821 Other seborrheic keratosis: Secondary | ICD-10-CM | POA: Diagnosis not present

## 2023-10-14 ENCOUNTER — Other Ambulatory Visit: Payer: Self-pay | Admitting: Internal Medicine

## 2023-10-14 DIAGNOSIS — F3341 Major depressive disorder, recurrent, in partial remission: Secondary | ICD-10-CM

## 2023-10-16 ENCOUNTER — Other Ambulatory Visit: Payer: Self-pay | Admitting: Internal Medicine

## 2023-10-16 DIAGNOSIS — F3341 Major depressive disorder, recurrent, in partial remission: Secondary | ICD-10-CM

## 2023-10-16 NOTE — Telephone Encounter (Signed)
 Copied from CRM [id number]. Topic: Clinical - Medication Refill >> Oct 16, 2023  5:15 PM [place] J wrote: Most Recent Primary Care Visit:  Provider: Arcadio Knuckles  Department: [facility]  Visit Type: OFFICE VISIT  Date: 05/11/2023  Medication: venlafaxine XR (EFFEXOR-XR) 150 MG 24 hr capsule  Has the patient contacted their pharmacy? Yes (Agent: If no, request that the patient contact the pharmacy for the refill. If patient does not wish to contact the pharmacy document the reason why and proceed with request.) (Agent: If yes, when and what did the pharmacy advise?) Pharmacy is calling for refill. Is this the correct pharmacy for this prescription? Yes If no, delete pharmacy and type the correct one.  This is the patient's preferred pharmacy:   [facility] [place], [place] - [address] [place] [address] Phone: [phone] Fax: [phone]  Has the prescription been filled recently? No  Is the patient out of the medication? Yes  Has the patient been seen for an appointment in the last year OR does the patient have an upcoming appointment? Yes  Can we respond through MyChart? Yes  Agent: Please be advised that Rx refills may take up to 3 business days. We ask that you follow-up with your pharmacy.

## 2023-10-17 MED ORDER — VENLAFAXINE HCL ER 150 MG PO CP24: 150.0000 mg | ORAL_CAPSULE | Freq: Every day | ORAL | 0 refills | Status: DC

## 2023-11-11 ENCOUNTER — Other Ambulatory Visit: Payer: Self-pay | Admitting: Internal Medicine

## 2023-11-11 DIAGNOSIS — I10 Essential (primary) hypertension: Secondary | ICD-10-CM

## 2023-11-11 DIAGNOSIS — E785 Hyperlipidemia, unspecified: Secondary | ICD-10-CM

## 2023-11-14 ENCOUNTER — Encounter: Payer: Self-pay | Admitting: Internal Medicine

## 2023-11-14 ENCOUNTER — Other Ambulatory Visit (HOSPITAL_COMMUNITY): Payer: Self-pay | Admitting: Cardiovascular Disease

## 2023-11-14 ENCOUNTER — Other Ambulatory Visit: Payer: Self-pay | Admitting: Internal Medicine

## 2023-11-14 DIAGNOSIS — I10 Essential (primary) hypertension: Secondary | ICD-10-CM

## 2023-11-14 MED ORDER — OLMESARTAN MEDOXOMIL 20 MG PO TABS: 20.0000 mg | ORAL_TABLET | Freq: Every day | ORAL | 0 refills | Status: DC

## 2024-01-08 DIAGNOSIS — F4321 Adjustment disorder with depressed mood: Secondary | ICD-10-CM | POA: Diagnosis not present

## 2024-01-10 DIAGNOSIS — F321 Major depressive disorder, single episode, moderate: Secondary | ICD-10-CM | POA: Diagnosis not present

## 2024-01-16 ENCOUNTER — Other Ambulatory Visit: Payer: Self-pay | Admitting: Internal Medicine

## 2024-01-16 DIAGNOSIS — F3341 Major depressive disorder, recurrent, in partial remission: Secondary | ICD-10-CM

## 2024-01-16 DIAGNOSIS — F4321 Adjustment disorder with depressed mood: Secondary | ICD-10-CM | POA: Diagnosis not present

## 2024-01-24 DIAGNOSIS — F4321 Adjustment disorder with depressed mood: Secondary | ICD-10-CM | POA: Diagnosis not present

## 2024-01-30 DIAGNOSIS — F321 Major depressive disorder, single episode, moderate: Secondary | ICD-10-CM | POA: Diagnosis not present

## 2024-02-05 DIAGNOSIS — F4321 Adjustment disorder with depressed mood: Secondary | ICD-10-CM | POA: Diagnosis not present

## 2024-02-12 ENCOUNTER — Other Ambulatory Visit: Payer: Self-pay | Admitting: Internal Medicine

## 2024-02-12 DIAGNOSIS — E785 Hyperlipidemia, unspecified: Secondary | ICD-10-CM

## 2024-02-12 DIAGNOSIS — I10 Essential (primary) hypertension: Secondary | ICD-10-CM

## 2024-02-12 DIAGNOSIS — F3341 Major depressive disorder, recurrent, in partial remission: Secondary | ICD-10-CM

## 2024-02-13 DIAGNOSIS — F321 Major depressive disorder, single episode, moderate: Secondary | ICD-10-CM | POA: Diagnosis not present

## 2024-02-19 DIAGNOSIS — F4321 Adjustment disorder with depressed mood: Secondary | ICD-10-CM | POA: Diagnosis not present

## 2024-02-20 DIAGNOSIS — F321 Major depressive disorder, single episode, moderate: Secondary | ICD-10-CM | POA: Diagnosis not present

## 2024-03-05 ENCOUNTER — Encounter: Payer: Self-pay | Admitting: Internal Medicine

## 2024-03-05 DIAGNOSIS — F4321 Adjustment disorder with depressed mood: Secondary | ICD-10-CM | POA: Diagnosis not present

## 2024-03-07 ENCOUNTER — Other Ambulatory Visit: Payer: Self-pay | Admitting: Internal Medicine

## 2024-03-07 DIAGNOSIS — F3341 Major depressive disorder, recurrent, in partial remission: Secondary | ICD-10-CM

## 2024-03-07 NOTE — Telephone Encounter (Signed)
 Copied from CRM #[id number]. Topic: Clinical - Medication Refill >> Mar 07, 2024  2:52 PM Pinkey ORN wrote: Medication: venlafaxine  XR (EFFEXOR -XR) 150 MG 24 hr capsule  Has the patient contacted their pharmacy? No (Agent: If no, request that the patient contact the pharmacy for the refill. If patient does not wish to contact the pharmacy document the reason why and proceed with request.) (Agent: If yes, when and what did the pharmacy advise?)  This is the patient's preferred pharmacy:  [facility] [place], [place] - [address] [place] [address] Phone: [phone] Fax: [phone]  Is this the correct pharmacy for this prescription? Yes If no, delete pharmacy and type the correct one.   Has the prescription been filled recently? Yes  Is the patient out of the medication? No  Has the patient been seen for an appointment in the last year OR does the patient have an upcoming appointment? Yes  Can we respond through MyChart? Yes  Agent: Please be advised that Rx refills may take up to 3 business days. We ask that you follow-up with your pharmacy. >> Mar 07, 2024  2:55 PM Pinkey ORN wrote: Patient leaves out for vacation on Saturday and is requesting this medication is filled before leaving.

## 2024-03-08 ENCOUNTER — Other Ambulatory Visit: Payer: Self-pay

## 2024-03-08 DIAGNOSIS — F3341 Major depressive disorder, recurrent, in partial remission: Secondary | ICD-10-CM

## 2024-03-08 MED ORDER — VENLAFAXINE HCL ER 150 MG PO CP24: 150.0000 mg | ORAL_CAPSULE | Freq: Every day | ORAL | 0 refills | Status: DC

## 2024-03-17 ENCOUNTER — Other Ambulatory Visit: Payer: Self-pay | Admitting: Internal Medicine

## 2024-03-17 DIAGNOSIS — E785 Hyperlipidemia, unspecified: Secondary | ICD-10-CM

## 2024-03-17 DIAGNOSIS — I10 Essential (primary) hypertension: Secondary | ICD-10-CM

## 2024-03-19 ENCOUNTER — Other Ambulatory Visit: Payer: Self-pay | Admitting: Internal Medicine

## 2024-03-19 ENCOUNTER — Encounter: Payer: Self-pay | Admitting: Internal Medicine

## 2024-03-19 DIAGNOSIS — I10 Essential (primary) hypertension: Secondary | ICD-10-CM

## 2024-03-19 DIAGNOSIS — E785 Hyperlipidemia, unspecified: Secondary | ICD-10-CM

## 2024-03-19 DIAGNOSIS — F321 Major depressive disorder, single episode, moderate: Secondary | ICD-10-CM | POA: Diagnosis not present

## 2024-03-19 DIAGNOSIS — F3341 Major depressive disorder, recurrent, in partial remission: Secondary | ICD-10-CM

## 2024-03-19 MED ORDER — VENLAFAXINE HCL ER 150 MG PO CP24: 150.0000 mg | ORAL_CAPSULE | Freq: Every day | ORAL | 0 refills | Status: DC

## 2024-03-19 MED ORDER — ROSUVASTATIN CALCIUM 20 MG PO TABS: 20.0000 mg | ORAL_TABLET | Freq: Every day | ORAL | 0 refills | Status: DC

## 2024-03-19 MED ORDER — INDAPAMIDE 1.25 MG PO TABS: 1.2500 mg | ORAL_TABLET | Freq: Every day | ORAL | 0 refills | Status: DC

## 2024-03-19 MED ORDER — OLMESARTAN MEDOXOMIL 20 MG PO TABS: 20.0000 mg | ORAL_TABLET | Freq: Every day | ORAL | 0 refills | Status: DC

## 2024-03-27 DIAGNOSIS — F4321 Adjustment disorder with depressed mood: Secondary | ICD-10-CM | POA: Diagnosis not present

## 2024-04-02 DIAGNOSIS — F321 Major depressive disorder, single episode, moderate: Secondary | ICD-10-CM | POA: Diagnosis not present

## 2024-04-10 DIAGNOSIS — F4321 Adjustment disorder with depressed mood: Secondary | ICD-10-CM | POA: Diagnosis not present

## 2024-04-14 ENCOUNTER — Encounter: Payer: Self-pay | Admitting: Internal Medicine

## 2024-04-15 ENCOUNTER — Other Ambulatory Visit: Payer: Self-pay | Admitting: Internal Medicine

## 2024-04-15 DIAGNOSIS — I10 Essential (primary) hypertension: Secondary | ICD-10-CM

## 2024-04-15 DIAGNOSIS — B002 Herpesviral gingivostomatitis and pharyngotonsillitis: Secondary | ICD-10-CM

## 2024-04-15 DIAGNOSIS — E785 Hyperlipidemia, unspecified: Secondary | ICD-10-CM

## 2024-04-16 DIAGNOSIS — F321 Major depressive disorder, single episode, moderate: Secondary | ICD-10-CM | POA: Diagnosis not present

## 2024-04-18 ENCOUNTER — Encounter: Payer: Self-pay | Admitting: Internal Medicine

## 2024-04-18 ENCOUNTER — Ambulatory Visit (INDEPENDENT_AMBULATORY_CARE_PROVIDER_SITE_OTHER): Admitting: Internal Medicine

## 2024-04-18 VITALS — BP 120/70 | HR 68 | Temp 98.2°F | Resp 16 | Ht 66.0 in | Wt 179.4 lb

## 2024-04-18 DIAGNOSIS — R7989 Other specified abnormal findings of blood chemistry: Secondary | ICD-10-CM | POA: Diagnosis not present

## 2024-04-18 DIAGNOSIS — F3341 Major depressive disorder, recurrent, in partial remission: Secondary | ICD-10-CM

## 2024-04-18 DIAGNOSIS — R3129 Other microscopic hematuria: Secondary | ICD-10-CM

## 2024-04-18 DIAGNOSIS — Z0000 Encounter for general adult medical examination without abnormal findings: Secondary | ICD-10-CM | POA: Diagnosis not present

## 2024-04-18 DIAGNOSIS — E785 Hyperlipidemia, unspecified: Secondary | ICD-10-CM

## 2024-04-18 DIAGNOSIS — I10 Essential (primary) hypertension: Secondary | ICD-10-CM

## 2024-04-18 DIAGNOSIS — Z23 Encounter for immunization: Secondary | ICD-10-CM | POA: Diagnosis not present

## 2024-04-18 DIAGNOSIS — Z0001 Encounter for general adult medical examination with abnormal findings: Secondary | ICD-10-CM

## 2024-04-18 LAB — BASIC METABOLIC PANEL WITH GFR
BUN: 17 mg/dL (ref 6–23)
CO2: 27 meq/L (ref 19–32)
Calcium: 10.1 mg/dL (ref 8.4–10.5)
Chloride: 101 meq/L (ref 96–112)
Creatinine, Ser: 0.8 mg/dL (ref 0.40–1.50)
GFR: 97.92 mL/min (ref >60.00)
Glucose, Bld: 72 mg/dL (ref 70–99)
Potassium: 4.3 meq/L (ref 3.5–5.1)
Sodium: 138 meq/L (ref 135–145)

## 2024-04-18 LAB — URINALYSIS, ROUTINE W REFLEX MICROSCOPIC
Bilirubin Urine: NEGATIVE
Hgb urine dipstick: NEGATIVE
Ketones, ur: NEGATIVE
Leukocytes,Ua: NEGATIVE
Nitrite: NEGATIVE
RBC / HPF: NONE SEEN (ref 0–?)
Specific Gravity, Urine: <=1.005 — AB (ref 1.000–1.030)
Total Protein, Urine: NEGATIVE
Urine Glucose: NEGATIVE
Urobilinogen, UA: 0.2 (ref 0.0–1.0)
pH: 6 (ref 5.0–8.0)

## 2024-04-18 LAB — CBC WITH DIFFERENTIAL/PLATELET
Basophils Absolute: 0.1 K/uL (ref 0.0–0.1)
Basophils Relative: 0.8 % (ref 0.0–3.0)
Eosinophils Absolute: 0.3 K/uL (ref 0.0–0.7)
Eosinophils Relative: 4 % (ref 0.0–5.0)
HCT: 42.4 % (ref 39.0–52.0)
Hemoglobin: 14.1 g/dL (ref 13.0–17.0)
Lymphocytes Relative: 33.1 % (ref 12.0–46.0)
Lymphs Abs: 2.3 K/uL (ref 0.7–4.0)
MCHC: 33.2 g/dL (ref 30.0–36.0)
MCV: 87.2 fl (ref 78.0–100.0)
Monocytes Absolute: 0.6 K/uL (ref 0.1–1.0)
Monocytes Relative: 8.3 % (ref 3.0–12.0)
Neutro Abs: 3.8 K/uL (ref 1.4–7.7)
Neutrophils Relative %: 53.8 % (ref 43.0–77.0)
Platelets: 217 K/uL (ref 150.0–400.0)
RBC: 4.86 Mil/uL (ref 4.22–5.81)
RDW: 13.2 % (ref 11.5–15.5)
WBC: 7.1 K/uL (ref 4.0–10.5)

## 2024-04-18 LAB — LIPID PANEL
Cholesterol: 161 mg/dL (ref 0–200)
HDL: 53.9 mg/dL (ref >39.00)
LDL Cholesterol: 93 mg/dL (ref 0–99)
NonHDL: 107.01
Total CHOL/HDL Ratio: 3
Triglycerides: 68 mg/dL (ref 0.0–149.0)
VLDL: 13.6 mg/dL (ref 0.0–40.0)

## 2024-04-18 LAB — HEPATIC FUNCTION PANEL
ALT: 20 U/L (ref 0–53)
AST: 22 U/L (ref 0–37)
Albumin: 5.1 g/dL (ref 3.5–5.2)
Alkaline Phosphatase: 54 U/L (ref 39–117)
Bilirubin, Direct: 0.1 mg/dL (ref 0.0–0.3)
Total Bilirubin: 0.3 mg/dL (ref 0.2–1.2)
Total Protein: 7.3 g/dL (ref 6.0–8.3)

## 2024-04-18 LAB — PSA: PSA: 1.39 ng/mL (ref 0.10–4.00)

## 2024-04-18 LAB — TSH: TSH: 1.1 u[IU]/mL (ref 0.35–5.50)

## 2024-04-18 MED ORDER — ROSUVASTATIN CALCIUM 20 MG PO TABS: 20.0000 mg | ORAL_TABLET | Freq: Every day | ORAL | 1 refills | Status: AC

## 2024-04-18 NOTE — Patient Instructions (Signed)
 Health Maintenance, Male  Adopting a healthy lifestyle and getting preventive care are important in promoting health and wellness. Ask your health care provider about:  The right schedule for you to have regular tests and exams.  Things you can do on your own to prevent diseases and keep yourself healthy.  What should I know about diet, weight, and exercise?  Eat a healthy diet    Eat a diet that includes plenty of vegetables, fruits, low-fat dairy products, and lean protein.  Do not eat a lot of foods that are high in solid fats, added sugars, or sodium.  Maintain a healthy weight  Body mass index (BMI) is a measurement that can be used to identify possible weight problems. It estimates body fat based on height and weight. Your health care provider can help determine your BMI and help you achieve or maintain a healthy weight.  Get regular exercise  Get regular exercise. This is one of the most important things you can do for your health. Most adults should:  Exercise for at least 150 minutes each week. The exercise should increase your heart rate and make you sweat (moderate-intensity exercise).  Do strengthening exercises at least twice a week. This is in addition to the moderate-intensity exercise.  Spend less time sitting. Even light physical activity can be beneficial.  Watch cholesterol and blood lipids  Have your blood tested for lipids and cholesterol at 58 years of age, then have this test every 5 years.  You may need to have your cholesterol levels checked more often if:  Your lipid or cholesterol levels are high.  You are older than 58 years of age.  You are at high risk for heart disease.  What should I know about cancer screening?  Many types of cancers can be detected early and may often be prevented. Depending on your health history and family history, you may need to have cancer screening at various ages. This may include screening for:  Colorectal cancer.  Prostate cancer.  Skin cancer.  Lung  cancer.  What should I know about heart disease, diabetes, and high blood pressure?  Blood pressure and heart disease  High blood pressure causes heart disease and increases the risk of stroke. This is more likely to develop in people who have high blood pressure readings or are overweight.  Talk with your health care provider about your target blood pressure readings.  Have your blood pressure checked:  Every 3-5 years if you are 24-52 years of age.  Every year if you are 3 years old or older.  If you are between the ages of 60 and 72 and are a current or former smoker, ask your health care provider if you should have a one-time screening for abdominal aortic aneurysm (AAA).  Diabetes  Have regular diabetes screenings. This checks your fasting blood sugar level. Have the screening done:  Once every three years after age 66 if you are at a normal weight and have a low risk for diabetes.  More often and at a younger age if you are overweight or have a high risk for diabetes.  What should I know about preventing infection?  Hepatitis B  If you have a higher risk for hepatitis B, you should be screened for this virus. Talk with your health care provider to find out if you are at risk for hepatitis B infection.  Hepatitis C  Blood testing is recommended for:  Everyone born from [birth year] through 1965.  Anyone  with known risk factors for hepatitis C.  Sexually transmitted infections (STIs)  You should be screened each year for STIs, including gonorrhea and chlamydia, if:  You are sexually active and are younger than 58 years of age.  You are older than 58 years of age and your health care provider tells you that you are at risk for this type of infection.  Your sexual activity has changed since you were last screened, and you are at increased risk for chlamydia or gonorrhea. Ask your health care provider if you are at risk.  Ask your health care provider about whether you are at high risk for HIV. Your health care provider  may recommend a prescription medicine to help prevent HIV infection. If you choose to take medicine to prevent HIV, you should first get tested for HIV. You should then be tested every 3 months for as long as you are taking the medicine.  Follow these instructions at home:  Alcohol use  Do not drink alcohol if your health care provider tells you not to drink.  If you drink alcohol:  Limit how much you have to 0-2 drinks a day.  Know how much alcohol is in your drink. In the [place] one drink equals one 12 oz bottle of beer (355 mL), one 5 oz glass of wine (148 mL), or one 1 oz glass of hard liquor (44 mL).  Lifestyle  Do not use any products that contain nicotine or tobacco. These products include cigarettes, chewing tobacco, and vaping devices, such as e-cigarettes. If you need help quitting, ask your health care provider.  Do not use street drugs.  Do not share needles.  Ask your health care provider for help if you need support or information about quitting drugs.  General instructions  Schedule regular health, dental, and eye exams.  Stay current with your vaccines.  Tell your health care provider if:  You often feel depressed.  You have ever been abused or do not feel safe at home.  Summary  Adopting a healthy lifestyle and getting preventive care are important in promoting health and wellness.  Follow your health care provider's instructions about healthy diet, exercising, and getting tested or screened for diseases.  Follow your health care provider's instructions on monitoring your cholesterol and blood pressure.  This information is not intended to replace advice given to you by your health care provider. Make sure you discuss any questions you have with your health care provider.  Document Revised: 11/09/2020 Document Reviewed: 11/09/2020  Elsevier Patient Education  2024 [employer].

## 2024-04-18 NOTE — Progress Notes (Signed)
 Subjective:  Patient ID: Ethan Crosby, male    DOB: 1966-03-14  Age: 58 y.o. MRN: [id number]  CC: Annual Exam, Hypertension, and Hyperlipidemia   HPI Ethan Crosby presents for a CPX and f/up ----  Discussed the use of AI scribe software for clinical note transcription with the patient, who gave verbal consent to proceed.  History of Present Illness Ethan Crosby is a 58 year old male who presents for a CPX.  He feels well overall and remains active, engaging in yoga every Sunday and participating in a rotation group every Saturday. He is considering joining a 24-hour fitness center for additional physical activity during the winter months.  He has experienced significant weight loss, dropping from 233 pounds to approximately 178 pounds, with a goal to reach 160 pounds. This weight loss was achieved with the help of semaglutide , which he initially obtained through [employer] and later through another online company. He has been on semaglutide  since March or April and reports no adverse effects such as abdominal pain, nausea, vomiting, diarrhea, or constipation.  He has stopped taking one of his blood pressure medications, the morning dose, after running out and not refilling it. Despite this, he reports no symptoms of low blood pressure such as dizziness or lightheadedness.  He follows a diet that is almost vegan, avoiding sugar, white flour, and dairy, and consuming lean meats and vegetables. He drinks a lot of water and follows dietary suggestions from [employer], focusing on avoiding processed foods.  He has received both his flu and COVID vaccinations. In terms of physical activity, he engages in yard work, including digging, clipping, and planting, and reports having great energy and good sleep.  No cardiac symptoms such as chest pain, shortness of breath, dizziness, or lightheadedness during physical activity. No symptoms of low blood pressure despite his weight  loss.     Outpatient Medications Prior to Visit  Medication Sig Dispense Refill  . Multiple Vitamin (MULTIVITAMIN) tablet Take 1 tablet by mouth daily.    . olmesartan  (BENICAR ) 20 MG tablet Take 1 tablet (20 mg total) by mouth daily. 30 tablet 0  . rosuvastatin  (CRESTOR ) 20 MG tablet Take 1 tablet (20 mg total) by mouth daily. 30 tablet 0  . venlafaxine  XR (EFFEXOR -XR) 150 MG 24 hr capsule Take 1 capsule (150 mg total) by mouth daily with breakfast. 30 capsule 0  . indapamide  (LOZOL ) 1.25 MG tablet Take 1 tablet (1.25 mg total) by mouth daily. (Patient not taking: Reported on 04/18/2024) 30 tablet 0  . naltrexone  (DEPADE) 50 MG tablet Take 1 tablet (50 mg total) by mouth daily. 90 tablet 0   No facility-administered medications prior to visit.    ROS Review of Systems  Constitutional:  Negative for appetite change, chills, diaphoresis, fatigue and fever.  HENT: Negative.    Eyes: Negative.   Respiratory: Negative.  Negative for cough, chest tightness, shortness of breath and wheezing.   Cardiovascular:  Negative for chest pain, palpitations and leg swelling.  Gastrointestinal: Negative.  Negative for abdominal pain, constipation, diarrhea, nausea and vomiting.  Endocrine: Negative.   Genitourinary: Negative.  Negative for difficulty urinating, hematuria, scrotal swelling and testicular pain.  Musculoskeletal: Negative.   Skin: Negative.   Neurological: Negative.  Negative for dizziness, weakness and light-headedness.  Hematological:  Negative for adenopathy. Does not bruise/bleed easily.  Psychiatric/Behavioral: Negative.      Objective:  BP 120/70 (BP Location: Left Arm, Patient Position: Sitting, Cuff Size: Normal)  Pulse 68   Temp 98.2 F (36.8 C) (Oral)   Resp 16   Ht 5' 6 (1.676 m)   Wt 179 lb 6.4 oz (81.4 kg)   SpO2 98%   BMI 28.96 kg/m   BP Readings from Last 3 Encounters:  04/18/24 120/70  05/30/23 106/68  05/11/23 (!) 152/104    Wt Readings from Last 3  Encounters:  04/18/24 179 lb 6.4 oz (81.4 kg)  05/11/23 225 lb 9.6 oz (102.3 kg)  11/15/22 212 lb (96.2 kg)    Physical Exam Vitals reviewed.  Constitutional:      Appearance: Normal appearance.  HENT:     Nose: Nose normal.     Mouth/Throat:     Mouth: Mucous membranes are moist.  Eyes:     General: No scleral icterus.    Conjunctiva/sclera: Conjunctivae normal.  Cardiovascular:     Rate and Rhythm: Normal rate and regular rhythm.     Heart sounds: Normal heart sounds, S1 normal and S2 normal. No murmur heard.    No friction rub. No gallop.     Comments: EKG- NSR, 67 bpm NS T wave changes No LVH or Q waves Unchanged  Pulmonary:     Effort: Pulmonary effort is normal.     Breath sounds: No stridor. No wheezing, rhonchi or rales.  Abdominal:     General: Abdomen is flat.     Palpations: There is no mass.     Tenderness: There is no abdominal tenderness. There is no guarding.     Hernia: No hernia is present. There is no hernia in the left inguinal area or right inguinal area.  Genitourinary:    Pubic Area: No rash.      Penis: Normal and circumcised.      Testes: Normal.     Epididymis:     Right: Normal.     Left: Normal.     Prostate: Normal. Not enlarged, not tender and no nodules present.     Rectum: Normal. Guaiac result negative. No mass, tenderness, anal fissure, external hemorrhoid or internal hemorrhoid. Normal anal tone.  Musculoskeletal:     Cervical back: Neck supple.     Right lower leg: No edema.     Left lower leg: No edema.  Lymphadenopathy:     Cervical: No cervical adenopathy.     Lower Body: No right inguinal adenopathy. No left inguinal adenopathy.  Skin:    General: Skin is warm and dry.     Findings: No rash.  Neurological:     General: No focal deficit present.     Mental Status: He is alert.  Psychiatric:        Mood and Affect: Mood normal.        Behavior: Behavior normal.     Lab Results  Component Value Date   WBC 7.1  04/18/2024   HGB 14.1 04/18/2024   HCT 42.4 04/18/2024   PLT 217.0 04/18/2024   GLUCOSE 72 04/18/2024   CHOL 161 04/18/2024   TRIG 68.0 04/18/2024   HDL 53.90 04/18/2024   LDLDIRECT 236.0 12/30/2015   LDLCALC 93 04/18/2024   ALT 20 04/18/2024   AST 22 04/18/2024   NA 138 04/18/2024   K 4.3 04/18/2024   CL 101 04/18/2024   CREATININE 0.80 04/18/2024   BUN 17 04/18/2024   CO2 27 04/18/2024   TSH 1.10 04/18/2024   PSA 1.39 04/18/2024   HGBA1C 5.3 12/30/2015    CT CORONARY MORPH W/CTA COR W/SCORE W/CA  W/CM &/OR WO/CM Addendum Date: 06/13/2023 ADDENDUM REPORT: 06/13/2023 04:17 EXAM: OVER-READ INTERPRETATION  CT CHEST The following report is an over-read performed by radiologist Dr. Suzen Dials of [facility] on 06/13/2023. This over-read does not include interpretation of cardiac or coronary anatomy or pathology. The coronary calcium  score/coronary CTA interpretation by the cardiologist is attached. COMPARISON:  None. FINDINGS: Cardiovascular: There are no significant extracardiac vascular findings. Mediastinum/Nodes: There are no enlarged lymph nodes within the visualized mediastinum. Lungs/Pleura: There is no pleural effusion. The visualized lungs appear clear. Upper abdomen: No significant findings in the visualized upper abdomen. Musculoskeletal/Chest wall: No chest wall mass or suspicious osseous findings within the visualized chest. IMPRESSION: No significant extracardiac findings within the visualized chest. Electronically Signed   By: Suzen Dials M.D.   On: 06/13/2023 04:17   Result Date: 06/13/2023 CLINICAL DATA:  1M with hypertension and chest pain. EXAM: Cardiac/Coronary  CT TECHNIQUE: The patient was scanned on a [employer]. FINDINGS: A 120 kV prospective scan was triggered in the descending thoracic aorta at 111 HU's. Axial non-contrast 3 mm slices were carried out through the heart. The data set was analyzed on a dedicated work station and  scored using the Agatson method. Gantry rotation speed was 250 msecs and collimation was .6 mm. No beta blockade and 0.8 mg of sl NTG was given. The 3D data set was reconstructed in 5% intervals of the 67-82 % of the R-R cycle. Diastolic phases were analyzed on a dedicated work station using MPR, MIP and VRT modes. The patient received 80 cc of contrast. Aorta: Normal size. Mild atherosclerosis of the aortic root. No dissection. Aortic Valve:  Trileaflet.  No calcifications. Coronary Arteries:  Normal coronary origin.  Right dominance. RCA is a large dominant artery that gives rise to PDA and PLVB. There is no plaque. Left main is a large artery that gives rise to LAD, RI, and LCX arteries. LAD is a large vessel that has minimal (<25%) soft plaque in the mid-disatal LAD. LCX is a non-dominant artery that gives rise to large OM1 and OM2 branches. There is no plaque. Coronary Calcium  Score: 0 Other findings: Normal pulmonary vein drainage into the left atrium. Normal let atrial appendage without a thrombus. Normal size of the pulmonary artery. Slab reconstruction artifact. Non-cardiac: See separate report from [facility]. IMPRESSION: 1. Coronary calcium  score of 0. This was 1st percentile for age-, sex, and race-matched controls. 2. Total plaque volume 55mm3 which is 11th percentile for age- and sex-matched controls (calcified plaque 0 mm3; non-calcified plaque 9 mm3). TPV is mild. 3. Normal coronary origin with right dominance. 4. Minimal (<25%) soft plaque in the mid-distal LAD.  CAD-RADS 1. 5.  Mild aortic atherosclerosis. RECOMMENDATIONS: 1. CAD-RADS 0: No evidence of CAD (0%). Consider non-atherosclerotic causes of chest pain. 2. CAD-RADS 1: Minimal non-obstructive CAD (0-24%). Consider non-atherosclerotic causes of chest pain. Consider preventive therapy and risk factor modification. 3. CAD-RADS 2: Mild non-obstructive CAD (25-49%). Consider non-atherosclerotic causes of chest pain. Consider preventive  therapy and risk factor modification. 4. CAD-RADS 3: Moderate stenosis. Consider symptom-guided anti-ischemic pharmacotherapy as well as risk factor modification per guideline directed care. Additional analysis with CT FFR will be submitted. 5. CAD-RADS 4: Severe stenosis. (70-99% or > 50% left main). Cardiac catheterization or CT FFR is recommended. Consider symptom-guided anti-ischemic pharmacotherapy as well as risk factor modification per guideline directed care. Invasive coronary angiography recommended with revascularization per published guideline statements. 6. CAD-RADS 5: Total coronary occlusion (100%). Consider  cardiac catheterization or viability assessment. Consider symptom-guided anti-ischemic pharmacotherapy as well as risk factor modification per guideline directed care. 7. CAD-RADS N: Non-diagnostic study. Obstructive CAD can't be excluded. Alternative evaluation is recommended. Annabella Scarce, MD Electronically Signed: By: Annabella Scarce M.D. On: 05/30/2023 13:29    Assessment & Plan:   Hyperlipidemia with target LDL less than 100- LDL goal achieved. Doing well on the statin  -     Lipid panel; Future -     Hepatic function panel; Future -     TSH; Future -     Rosuvastatin  Calcium ; Take 1 tablet (20 mg total) by mouth daily.  Dispense: 90 tablet; Refill: 1  Other microscopic hematuria -     Urinalysis, Routine w reflex microscopic; Future  Primary hypertension- BP is over-controlled. EKG is negative for LVH. Will discontinue the BP meds. -     CBC with Differential/Platelet; Future -     TSH; Future -     Basic metabolic panel with GFR; Future -     EKG 12-Lead  Encounter for general adult medical examination with abnormal findings- Exam completed, labs reviewed, vaccines reviewed and updated, cancer screenings addressed, pt ed material was given.  -     PSA; Future  Elevated LFTs -     Hepatitis B surface antibody,quantitative; Future -     Hepatitis B surface  antigen; Future -     Hepatitis A antibody, total; Future  Immunization due -     Pneumococcal conjugate vaccine 20-valent  Recurrent major depressive disorder, in partial remission -     Venlafaxine  HCl ER; Take 1 capsule (150 mg total) by mouth daily with breakfast.  Dispense: 90 capsule; Refill: 1     Follow-up: Return in about 6 months (around 10/17/2024).  Debby Molt, MD

## 2024-04-19 ENCOUNTER — Encounter: Payer: Self-pay | Admitting: Internal Medicine

## 2024-04-19 LAB — HEPATITIS A ANTIBODY, TOTAL: Hepatitis A AB,Total: REACTIVE — AB

## 2024-04-19 LAB — HEPATITIS B SURFACE ANTIBODY, QUANTITATIVE: Hep B S AB Quant (Post): >1000 m[IU]/mL (ref >=10)

## 2024-04-19 LAB — HEPATITIS B SURFACE ANTIGEN: Hepatitis B Surface Ag: NONREACTIVE

## 2024-04-20 ENCOUNTER — Ambulatory Visit: Payer: Self-pay | Admitting: Internal Medicine

## 2024-04-20 MED ORDER — VENLAFAXINE HCL ER 150 MG PO CP24: 150.0000 mg | ORAL_CAPSULE | Freq: Every day | ORAL | 1 refills | Status: AC

## 2024-04-23 DIAGNOSIS — F4321 Adjustment disorder with depressed mood: Secondary | ICD-10-CM | POA: Diagnosis not present

## 2024-04-29 DIAGNOSIS — F4321 Adjustment disorder with depressed mood: Secondary | ICD-10-CM | POA: Diagnosis not present

## 2024-04-30 DIAGNOSIS — F321 Major depressive disorder, single episode, moderate: Secondary | ICD-10-CM | POA: Diagnosis not present

## 2024-05-15 DIAGNOSIS — F4321 Adjustment disorder with depressed mood: Secondary | ICD-10-CM | POA: Diagnosis not present

## 2024-05-22 DIAGNOSIS — F4321 Adjustment disorder with depressed mood: Secondary | ICD-10-CM | POA: Diagnosis not present

## 2024-05-24 DIAGNOSIS — F4323 Adjustment disorder with mixed anxiety and depressed mood: Secondary | ICD-10-CM | POA: Diagnosis not present

## 2024-06-01 ENCOUNTER — Other Ambulatory Visit: Payer: Self-pay | Admitting: Internal Medicine

## 2024-06-01 DIAGNOSIS — Z7252 High risk homosexual behavior: Secondary | ICD-10-CM

## 2024-06-01 MED ORDER — DOXYCYCLINE HYCLATE 100 MG PO TABS: 200.0000 mg | ORAL_TABLET | Freq: Once | ORAL | 1 refills | Status: AC

## 2024-06-05 DIAGNOSIS — F4321 Adjustment disorder with depressed mood: Secondary | ICD-10-CM | POA: Diagnosis not present

## 2024-06-19 DIAGNOSIS — F4321 Adjustment disorder with depressed mood: Secondary | ICD-10-CM | POA: Diagnosis not present

## 2024-06-21 DIAGNOSIS — F4323 Adjustment disorder with mixed anxiety and depressed mood: Secondary | ICD-10-CM | POA: Diagnosis not present
